# Patient Record
Sex: Female | Born: 1955 | Race: Black or African American | Hispanic: No | Marital: Single
Health system: Southern US, Community
[De-identification: ages and names within clinical notes are randomized; demographics above are authoritative.]

## PROBLEM LIST (undated history)

## (undated) DIAGNOSIS — Z930 Tracheostomy status: Secondary | ICD-10-CM

## (undated) DIAGNOSIS — I270 Primary pulmonary hypertension: Secondary | ICD-10-CM

## (undated) DIAGNOSIS — Z6835 Body mass index (BMI) 35.0-35.9, adult: Secondary | ICD-10-CM

## (undated) DIAGNOSIS — J9690 Respiratory failure, unspecified, unspecified whether with hypoxia or hypercapnia: Secondary | ICD-10-CM

## (undated) DIAGNOSIS — I10 Essential (primary) hypertension: Secondary | ICD-10-CM

## (undated) DIAGNOSIS — Z9911 Dependence on respirator [ventilator] status: Secondary | ICD-10-CM

## (undated) DIAGNOSIS — G4733 Obstructive sleep apnea (adult) (pediatric): Secondary | ICD-10-CM

## (undated) DIAGNOSIS — F419 Anxiety disorder, unspecified: Secondary | ICD-10-CM

## (undated) DIAGNOSIS — N289 Disorder of kidney and ureter, unspecified: Secondary | ICD-10-CM

## (undated) DIAGNOSIS — D649 Anemia, unspecified: Secondary | ICD-10-CM

## (undated) DIAGNOSIS — F209 Schizophrenia, unspecified: Secondary | ICD-10-CM

## (undated) DIAGNOSIS — M1990 Unspecified osteoarthritis, unspecified site: Secondary | ICD-10-CM

## (undated) DIAGNOSIS — K219 Gastro-esophageal reflux disease without esophagitis: Secondary | ICD-10-CM

## (undated) HISTORY — PX: TRACHEOSTOMY: SUR1362

---

## 2017-01-05 ENCOUNTER — Inpatient Hospital Stay (HOSPITAL_COMMUNITY)
Admission: EM | Admit: 2017-01-05 | Discharge: 2017-02-07 | DRG: 870 | Disposition: E | Payer: Medicaid Other | Attending: Internal Medicine | Admitting: Internal Medicine

## 2017-01-05 ENCOUNTER — Inpatient Hospital Stay (HOSPITAL_COMMUNITY): Payer: Medicaid Other

## 2017-01-05 ENCOUNTER — Encounter (HOSPITAL_COMMUNITY): Payer: Self-pay

## 2017-01-05 ENCOUNTER — Emergency Department (HOSPITAL_COMMUNITY): Payer: Medicaid Other

## 2017-01-05 DIAGNOSIS — J9601 Acute respiratory failure with hypoxia: Secondary | ICD-10-CM | POA: Diagnosis not present

## 2017-01-05 DIAGNOSIS — R0600 Dyspnea, unspecified: Secondary | ICD-10-CM | POA: Diagnosis not present

## 2017-01-05 DIAGNOSIS — M1990 Unspecified osteoarthritis, unspecified site: Secondary | ICD-10-CM | POA: Diagnosis not present

## 2017-01-05 DIAGNOSIS — J9611 Chronic respiratory failure with hypoxia: Secondary | ICD-10-CM | POA: Diagnosis not present

## 2017-01-05 DIAGNOSIS — F209 Schizophrenia, unspecified: Secondary | ICD-10-CM | POA: Diagnosis present

## 2017-01-05 DIAGNOSIS — R1010 Upper abdominal pain, unspecified: Secondary | ICD-10-CM

## 2017-01-05 DIAGNOSIS — R34 Anuria and oliguria: Secondary | ICD-10-CM | POA: Diagnosis not present

## 2017-01-05 DIAGNOSIS — M069 Rheumatoid arthritis, unspecified: Secondary | ICD-10-CM | POA: Diagnosis present

## 2017-01-05 DIAGNOSIS — Z452 Encounter for adjustment and management of vascular access device: Secondary | ICD-10-CM

## 2017-01-05 DIAGNOSIS — R0603 Acute respiratory distress: Secondary | ICD-10-CM

## 2017-01-05 DIAGNOSIS — E872 Acidosis: Secondary | ICD-10-CM | POA: Diagnosis not present

## 2017-01-05 DIAGNOSIS — A419 Sepsis, unspecified organism: Secondary | ICD-10-CM | POA: Diagnosis present

## 2017-01-05 DIAGNOSIS — Z87820 Personal history of traumatic brain injury: Secondary | ICD-10-CM | POA: Diagnosis not present

## 2017-01-05 DIAGNOSIS — J9690 Respiratory failure, unspecified, unspecified whether with hypoxia or hypercapnia: Secondary | ICD-10-CM | POA: Diagnosis present

## 2017-01-05 DIAGNOSIS — J80 Acute respiratory distress syndrome: Secondary | ICD-10-CM

## 2017-01-05 DIAGNOSIS — N170 Acute kidney failure with tubular necrosis: Secondary | ICD-10-CM | POA: Diagnosis not present

## 2017-01-05 DIAGNOSIS — Z9911 Dependence on respirator [ventilator] status: Secondary | ICD-10-CM

## 2017-01-05 DIAGNOSIS — R112 Nausea with vomiting, unspecified: Secondary | ICD-10-CM

## 2017-01-05 DIAGNOSIS — I129 Hypertensive chronic kidney disease with stage 1 through stage 4 chronic kidney disease, or unspecified chronic kidney disease: Secondary | ICD-10-CM | POA: Diagnosis not present

## 2017-01-05 DIAGNOSIS — K219 Gastro-esophageal reflux disease without esophagitis: Secondary | ICD-10-CM | POA: Diagnosis not present

## 2017-01-05 DIAGNOSIS — I270 Primary pulmonary hypertension: Secondary | ICD-10-CM | POA: Diagnosis present

## 2017-01-05 DIAGNOSIS — E8779 Other fluid overload: Secondary | ICD-10-CM | POA: Diagnosis not present

## 2017-01-05 DIAGNOSIS — G92 Toxic encephalopathy: Secondary | ICD-10-CM | POA: Diagnosis not present

## 2017-01-05 DIAGNOSIS — E662 Morbid (severe) obesity with alveolar hypoventilation: Secondary | ICD-10-CM | POA: Diagnosis present

## 2017-01-05 DIAGNOSIS — L8990 Pressure ulcer of unspecified site, unspecified stage: Secondary | ICD-10-CM | POA: Insufficient documentation

## 2017-01-05 DIAGNOSIS — R1110 Vomiting, unspecified: Secondary | ICD-10-CM | POA: Diagnosis present

## 2017-01-05 DIAGNOSIS — L89212 Pressure ulcer of right hip, stage 2: Secondary | ICD-10-CM | POA: Diagnosis present

## 2017-01-05 DIAGNOSIS — D631 Anemia in chronic kidney disease: Secondary | ICD-10-CM | POA: Diagnosis present

## 2017-01-05 DIAGNOSIS — J9600 Acute respiratory failure, unspecified whether with hypoxia or hypercapnia: Secondary | ICD-10-CM | POA: Diagnosis not present

## 2017-01-05 DIAGNOSIS — I959 Hypotension, unspecified: Secondary | ICD-10-CM | POA: Diagnosis present

## 2017-01-05 DIAGNOSIS — J690 Pneumonitis due to inhalation of food and vomit: Secondary | ICD-10-CM | POA: Diagnosis not present

## 2017-01-05 DIAGNOSIS — Z930 Tracheostomy status: Secondary | ICD-10-CM

## 2017-01-05 DIAGNOSIS — F5089 Other specified eating disorder: Secondary | ICD-10-CM

## 2017-01-05 DIAGNOSIS — E162 Hypoglycemia, unspecified: Secondary | ICD-10-CM | POA: Diagnosis not present

## 2017-01-05 DIAGNOSIS — N1330 Unspecified hydronephrosis: Secondary | ICD-10-CM | POA: Diagnosis present

## 2017-01-05 DIAGNOSIS — N179 Acute kidney failure, unspecified: Secondary | ICD-10-CM

## 2017-01-05 DIAGNOSIS — N183 Chronic kidney disease, stage 3 (moderate): Secondary | ICD-10-CM | POA: Diagnosis not present

## 2017-01-05 DIAGNOSIS — Z6841 Body Mass Index (BMI) 40.0 and over, adult: Secondary | ICD-10-CM | POA: Diagnosis not present

## 2017-01-05 DIAGNOSIS — E8770 Fluid overload, unspecified: Secondary | ICD-10-CM | POA: Diagnosis not present

## 2017-01-05 DIAGNOSIS — Z4659 Encounter for fitting and adjustment of other gastrointestinal appliance and device: Secondary | ICD-10-CM

## 2017-01-05 DIAGNOSIS — F419 Anxiety disorder, unspecified: Secondary | ICD-10-CM | POA: Diagnosis not present

## 2017-01-05 DIAGNOSIS — R6521 Severe sepsis with septic shock: Secondary | ICD-10-CM | POA: Diagnosis present

## 2017-01-05 DIAGNOSIS — E876 Hypokalemia: Secondary | ICD-10-CM | POA: Diagnosis present

## 2017-01-05 DIAGNOSIS — I468 Cardiac arrest due to other underlying condition: Secondary | ICD-10-CM | POA: Diagnosis not present

## 2017-01-05 DIAGNOSIS — J9621 Acute and chronic respiratory failure with hypoxia: Secondary | ICD-10-CM | POA: Diagnosis not present

## 2017-01-05 DIAGNOSIS — Y95 Nosocomial condition: Secondary | ICD-10-CM | POA: Diagnosis present

## 2017-01-05 DIAGNOSIS — I469 Cardiac arrest, cause unspecified: Secondary | ICD-10-CM | POA: Diagnosis not present

## 2017-01-05 DIAGNOSIS — L89222 Pressure ulcer of left hip, stage 2: Secondary | ICD-10-CM | POA: Diagnosis present

## 2017-01-05 DIAGNOSIS — R109 Unspecified abdominal pain: Secondary | ICD-10-CM

## 2017-01-05 HISTORY — DX: Unspecified osteoarthritis, unspecified site: M19.90

## 2017-01-05 HISTORY — DX: Body mass index (BMI) 35.0-35.9, adult: Z68.35

## 2017-01-05 HISTORY — DX: Obstructive sleep apnea (adult) (pediatric): G47.33

## 2017-01-05 HISTORY — DX: Essential (primary) hypertension: I10

## 2017-01-05 HISTORY — DX: Anxiety disorder, unspecified: F41.9

## 2017-01-05 HISTORY — DX: Anemia, unspecified: D64.9

## 2017-01-05 HISTORY — DX: Morbid (severe) obesity due to excess calories: E66.01

## 2017-01-05 HISTORY — DX: Tracheostomy status: Z93.0

## 2017-01-05 HISTORY — DX: Primary pulmonary hypertension: I27.0

## 2017-01-05 HISTORY — DX: Schizophrenia, unspecified: F20.9

## 2017-01-05 HISTORY — DX: Dependence on respirator (ventilator) status: Z99.11

## 2017-01-05 HISTORY — DX: Gastro-esophageal reflux disease without esophagitis: K21.9

## 2017-01-05 HISTORY — DX: Respiratory failure, unspecified, unspecified whether with hypoxia or hypercapnia: J96.90

## 2017-01-05 HISTORY — DX: Disorder of kidney and ureter, unspecified: N28.9

## 2017-01-05 LAB — COMPREHENSIVE METABOLIC PANEL
ALK PHOS: 101 U/L (ref 38–126)
ALT: 14 U/L (ref 14–54)
ALT: 13 U/L — AB (ref 14–54)
ANION GAP: 16 — AB (ref 5–15)
AST: 15 U/L (ref 15–41)
AST: 15 U/L (ref 15–41)
Albumin: 2.6 g/dL — ABNORMAL LOW (ref 3.5–5.0)
Albumin: 2.4 g/dL — ABNORMAL LOW (ref 3.5–5.0)
Alkaline Phosphatase: 106 U/L (ref 38–126)
Anion gap: 16 — ABNORMAL HIGH (ref 5–15)
BUN: 15 mg/dL (ref 6–20)
BUN: 14 mg/dL (ref 6–20)
CALCIUM: 8.9 mg/dL (ref 8.9–10.3)
CHLORIDE: 96 mmol/L — AB (ref 101–111)
CO2: 29 mmol/L (ref 22–32)
CO2: 28 mmol/L (ref 22–32)
CREATININE: 1.66 mg/dL — AB (ref 0.44–1.00)
Calcium: 9.2 mg/dL (ref 8.9–10.3)
Chloride: 98 mmol/L — ABNORMAL LOW (ref 101–111)
Creatinine, Ser: 1.46 mg/dL — ABNORMAL HIGH (ref 0.44–1.00)
GFR calc non Af Amer: 33 mL/min — ABNORMAL LOW (ref >60)
GFR, EST AFRICAN AMERICAN: 44 mL/min — AB (ref >60)
GFR, EST AFRICAN AMERICAN: 38 mL/min — AB (ref >60)
GFR, EST NON AFRICAN AMERICAN: 38 mL/min — AB (ref >60)
Glucose, Bld: 145 mg/dL — ABNORMAL HIGH (ref 65–99)
Glucose, Bld: 156 mg/dL — ABNORMAL HIGH (ref 65–99)
POTASSIUM: 4 mmol/L (ref 3.5–5.1)
Potassium: 3.8 mmol/L (ref 3.5–5.1)
Sodium: 141 mmol/L (ref 135–145)
Sodium: 142 mmol/L (ref 135–145)
Total Bilirubin: 1 mg/dL (ref 0.3–1.2)
Total Bilirubin: 1.3 mg/dL — ABNORMAL HIGH (ref 0.3–1.2)
Total Protein: 7.3 g/dL (ref 6.5–8.1)
Total Protein: 7.3 g/dL (ref 6.5–8.1)

## 2017-01-05 LAB — I-STAT ARTERIAL BLOOD GAS, ED
ACID-BASE EXCESS: 3 mmol/L — AB (ref 0.0–2.0)
Bicarbonate: 28.5 mmol/L — ABNORMAL HIGH (ref 20.0–28.0)
O2 Saturation: 94 %
PCO2 ART: 48.6 mmHg — AB (ref 32.0–48.0)
PH ART: 7.376 (ref 7.350–7.450)
Patient temperature: 98.6
TCO2: 30 mmol/L (ref 0–100)
pO2, Arterial: 75 mmHg — ABNORMAL LOW (ref 83.0–108.0)

## 2017-01-05 LAB — CBC
HCT: 32.6 % — ABNORMAL LOW (ref 36.0–46.0)
Hemoglobin: 9.6 g/dL — ABNORMAL LOW (ref 12.0–15.0)
MCH: 27.9 pg (ref 26.0–34.0)
MCHC: 29.4 g/dL — AB (ref 30.0–36.0)
MCV: 94.8 fL (ref 78.0–100.0)
PLATELETS: 432 10*3/uL — AB (ref 150–400)
RBC: 3.44 MIL/uL — ABNORMAL LOW (ref 3.87–5.11)
RDW: 17.4 % — ABNORMAL HIGH (ref 11.5–15.5)
WBC: 24.7 10*3/uL — ABNORMAL HIGH (ref 4.0–10.5)

## 2017-01-05 LAB — PHOSPHORUS: PHOSPHORUS: 3.2 mg/dL (ref 2.5–4.6)

## 2017-01-05 LAB — I-STAT CG4 LACTIC ACID, ED
LACTIC ACID, VENOUS: 2.03 mmol/L — AB (ref 0.5–1.9)
Lactic Acid, Venous: 2.05 mmol/L (ref 0.5–1.9)

## 2017-01-05 LAB — CBC WITH DIFFERENTIAL/PLATELET
BASOS ABS: 0.1 10*3/uL (ref 0.0–0.1)
BASOS PCT: 0 %
EOS PCT: 4 %
Eosinophils Absolute: 0.7 10*3/uL (ref 0.0–0.7)
HEMATOCRIT: 33 % — AB (ref 36.0–46.0)
Hemoglobin: 9.5 g/dL — ABNORMAL LOW (ref 12.0–15.0)
Lymphocytes Relative: 8 %
Lymphs Abs: 1.5 10*3/uL (ref 0.7–4.0)
MCH: 27.6 pg (ref 26.0–34.0)
MCHC: 28.8 g/dL — ABNORMAL LOW (ref 30.0–36.0)
MCV: 95.9 fL (ref 78.0–100.0)
MONO ABS: 1.2 10*3/uL — AB (ref 0.1–1.0)
MONOS PCT: 6 %
NEUTROS ABS: 15.7 10*3/uL — AB (ref 1.7–7.7)
Neutrophils Relative %: 82 %
PLATELETS: 426 10*3/uL — AB (ref 150–400)
RBC: 3.44 MIL/uL — ABNORMAL LOW (ref 3.87–5.11)
RDW: 17.4 % — AB (ref 11.5–15.5)
WBC: 19.2 10*3/uL — ABNORMAL HIGH (ref 4.0–10.5)

## 2017-01-05 LAB — URINALYSIS, ROUTINE W REFLEX MICROSCOPIC
Bilirubin Urine: NEGATIVE
GLUCOSE, UA: NEGATIVE mg/dL
Ketones, ur: 20 mg/dL — AB
NITRITE: NEGATIVE
PH: 6 (ref 5.0–8.0)
Protein, ur: 100 mg/dL — AB
SPECIFIC GRAVITY, URINE: 1.015 (ref 1.005–1.030)

## 2017-01-05 LAB — STREP PNEUMONIAE URINARY ANTIGEN: Strep Pneumo Urinary Antigen: NEGATIVE

## 2017-01-05 LAB — LACTIC ACID, PLASMA
LACTIC ACID, VENOUS: 1.4 mmol/L (ref 0.5–1.9)
Lactic Acid, Venous: 1.6 mmol/L (ref 0.5–1.9)

## 2017-01-05 LAB — PROTIME-INR
INR: 1.09
PROTHROMBIN TIME: 14.1 s (ref 11.4–15.2)

## 2017-01-05 LAB — LIPASE, BLOOD: Lipase: 11 U/L (ref 11–51)

## 2017-01-05 LAB — TROPONIN I
TROPONIN I: 0.03 ng/mL — AB (ref <0.03)
Troponin I: 0.05 ng/mL (ref <0.03)

## 2017-01-05 LAB — APTT: aPTT: 31 seconds (ref 24–36)

## 2017-01-05 LAB — MAGNESIUM: MAGNESIUM: 1.7 mg/dL (ref 1.7–2.4)

## 2017-01-05 LAB — AMYLASE: AMYLASE: 88 U/L (ref 28–100)

## 2017-01-05 LAB — PROCALCITONIN: Procalcitonin: 1.59 ng/mL

## 2017-01-05 MED ORDER — SODIUM CHLORIDE 0.9 % IV SOLN
INTRAVENOUS | Status: DC
Start: 2017-01-05 — End: 2017-01-10
  Administered 2017-01-05: 100 mL/h via INTRAVENOUS
  Administered 2017-01-05: 15:00:00 via INTRAVENOUS
  Administered 2017-01-06: 100 mL/h via INTRAVENOUS
  Administered 2017-01-06: 03:00:00 via INTRAVENOUS
  Administered 2017-01-07: 100 mL/h via INTRAVENOUS
  Administered 2017-01-08: 75 mL/h via INTRAVENOUS
  Administered 2017-01-08 – 2017-01-09 (×2): via INTRAVENOUS

## 2017-01-05 MED ORDER — IPRATROPIUM-ALBUTEROL 0.5-2.5 (3) MG/3ML IN SOLN
3.0000 mL | Freq: Four times a day (QID) | RESPIRATORY_TRACT | Status: DC
  Administered 2017-01-05 – 2017-01-09 (×16): 3 mL via RESPIRATORY_TRACT
  Filled 2017-01-05 (×16): qty 3

## 2017-01-05 MED ORDER — VANCOMYCIN HCL 10 G IV SOLR
1500.0000 mg | INTRAVENOUS | Status: DC
  Filled 2017-01-05: qty 1500

## 2017-01-05 MED ORDER — PIPERACILLIN-TAZOBACTAM 3.375 G IVPB 30 MIN
3.3750 g | Freq: Once | INTRAVENOUS | Status: AC
  Administered 2017-01-05: 3.375 g via INTRAVENOUS
  Filled 2017-01-05: qty 50

## 2017-01-05 MED ORDER — ACETAMINOPHEN 650 MG RE SUPP
650.0000 mg | Freq: Once | RECTAL | Status: AC
  Administered 2017-01-05: 650 mg via RECTAL
  Filled 2017-01-05: qty 1

## 2017-01-05 MED ORDER — HEPARIN SODIUM (PORCINE) 5000 UNIT/ML IJ SOLN
5000.0000 [IU] | Freq: Three times a day (TID) | INTRAMUSCULAR | Status: DC
  Administered 2017-01-05 – 2017-01-10 (×16): 5000 [IU] via SUBCUTANEOUS
  Filled 2017-01-05 (×15): qty 1

## 2017-01-05 MED ORDER — ONDANSETRON HCL 4 MG/2ML IJ SOLN
4.0000 mg | Freq: Once | INTRAMUSCULAR | Status: AC
  Administered 2017-01-05: 4 mg via INTRAVENOUS
  Filled 2017-01-05: qty 2

## 2017-01-05 MED ORDER — VANCOMYCIN HCL 10 G IV SOLR
2500.0000 mg | Freq: Once | INTRAVENOUS | Status: AC
  Administered 2017-01-05: 2500 mg via INTRAVENOUS
  Filled 2017-01-05: qty 2500

## 2017-01-05 MED ORDER — FENTANYL CITRATE (PF) 100 MCG/2ML IJ SOLN
25.0000 ug | INTRAMUSCULAR | Status: DC | PRN
  Administered 2017-01-06 – 2017-01-09 (×22): 100 ug via INTRAVENOUS
  Filled 2017-01-05 (×22): qty 2

## 2017-01-05 MED ORDER — PIPERACILLIN-TAZOBACTAM 3.375 G IVPB
3.3750 g | Freq: Three times a day (TID) | INTRAVENOUS | Status: DC
  Administered 2017-01-05 – 2017-01-07 (×5): 3.375 g via INTRAVENOUS
  Filled 2017-01-05 (×6): qty 50

## 2017-01-05 MED ORDER — SODIUM CHLORIDE 0.9 % IV SOLN
1000.0000 mL | INTRAVENOUS | Status: DC
  Administered 2017-01-05 (×2): 1000 mL via INTRAVENOUS

## 2017-01-05 MED ORDER — FAMOTIDINE IN NACL 20-0.9 MG/50ML-% IV SOLN
20.0000 mg | Freq: Two times a day (BID) | INTRAVENOUS | Status: DC
  Administered 2017-01-05 – 2017-01-06 (×4): 20 mg via INTRAVENOUS
  Filled 2017-01-05 (×5): qty 50

## 2017-01-05 MED ORDER — SODIUM CHLORIDE 0.9 % IV SOLN: 250.0000 mL | INTRAVENOUS | Status: DC | PRN

## 2017-01-05 MED ORDER — SODIUM CHLORIDE 0.9 % IV SOLN
3.0000 g | Freq: Once | INTRAVENOUS | Status: AC
  Administered 2017-01-05: 3 g via INTRAVENOUS
  Filled 2017-01-05: qty 3

## 2017-01-05 NOTE — ED Provider Notes (Signed)
[facility]-EMERGENCY DEPT Provider Note   CSN: [id number] Arrival date & time: 01/07/2017  0840     History   Chief Complaint Chief Complaint  Patient presents with  . Aspiration    HPI Hannah Powers is a 61 y.o. female.  61 year old female with history of chronic vent dependency who presents from long-term care facility due to aspiration. Was found by staff with stomach contents coming through her trach site as well as her mouth. She was suction at that time. She was found to be more tachypnic. She Is Also Found to Be Hypotensive. Transport was called for and patient transported here. No further history obtainable due to her current state      No past medical history on file.  There are no active problems to display for this patient.   No past surgical history on file.  OB History    No data available       Home Medications    Prior to Admission medications   Not on File    Family History No family history on file.  Social History Social History  Substance Use Topics  . Smoking status: Not on file  . Smokeless tobacco: Not on file  . Alcohol use Not on file     Allergies   Patient has no allergy information on record.   Review of Systems Review of Systems  Unable to perform ROS: Acuity of condition     Physical Exam Updated Vital Signs There were no vitals taken for this visit.  Physical Exam  Constitutional: She appears well-developed and well-nourished. She is cooperative. She has a sickly appearance. She appears ill.  HENT:  Head: Normocephalic and atraumatic.  Eyes: Conjunctivae, EOM and lids are normal. Pupils are equal, round, and reactive to light.  Neck: Normal range of motion. Neck supple. No tracheal deviation present. No thyroid mass present.  Cardiovascular: Normal rate, regular rhythm and normal heart sounds.  Exam reveals no gallop.   No murmur heard. Pulmonary/Chest: Effort normal. No stridor. No respiratory distress. She has  decreased breath sounds in the right lower field and the left lower field. She has no wheezes. She has rhonchi in the right lower field and the left lower field. She has no rales.  Abdominal: Soft. Normal appearance and bowel sounds are normal. She exhibits no distension. There is no tenderness. There is no rebound and no CVA tenderness.  Musculoskeletal: Normal range of motion. She exhibits no edema or tenderness.  Neurological: She is alert. She displays atrophy. No cranial nerve deficit. GCS eye subscore is 4. GCS verbal subscore is 5. GCS motor subscore is 6.  Skin: Skin is warm and dry. No abrasion and no rash noted.  Nursing note and vitals reviewed.    ED Treatments / Results  Labs (all labs ordered are listed, but only abnormal results are displayed) Labs Reviewed  CULTURE, BLOOD (ROUTINE X 2)  CULTURE, BLOOD (ROUTINE X 2)  COMPREHENSIVE METABOLIC PANEL  CBC WITH DIFFERENTIAL/PLATELET  URINALYSIS, ROUTINE W REFLEX MICROSCOPIC  I-STAT CG4 LACTIC ACID, ED    EKG  EKG Interpretation None       Radiology No results found.  Procedures Procedures (including critical care time)  Medications Ordered in ED Medications  0.9 %  sodium chloride infusion (not administered)     Initial Impression / Assessment and Plan / ED Course  I have reviewed the triage vital signs and the nursing notes.  Pertinent labs & imaging results that were available during  my care of the patient were reviewed by me and considered in my medical decision making (see chart for details).     Patient with evidence of pneumonia on chest x-ray. Code sepsis initiated. Started on IV antibiotics. Fluid boluses well. Patient's initial lactate noted. Patient did have further emesis here. Concern for intra-abdominal process and abdominal CT without contrast is pending. Patient seen by critical care and due to the patient having desaturations will be admitted to the ICU. Critical care will follow-up on the  patient's abdominal CT.  CRITICAL CARE Performed by: Toy Baker Total critical care time: 60 minutes Critical care time was exclusive of separately billable procedures and treating other patients. Critical care was necessary to treat or prevent imminent or life-threatening deterioration. Critical care was time spent personally by me on the following activities: development of treatment plan with patient and/or surrogate as well as nursing, discussions with consultants, evaluation of patient's response to treatment, examination of patient, obtaining history from patient or surrogate, ordering and performing treatments and interventions, ordering and review of laboratory studies, ordering and review of radiographic studies, pulse oximetry and re-evaluation of patient's condition.   Final Clinical Impressions(s) / ED Diagnoses   Final diagnoses:  None    New Prescriptions New Prescriptions   No medications on file     Lorre Nick, MD 12/28/2016 1213

## 2017-01-05 NOTE — Progress Notes (Signed)
Pharmacy Antibiotic Note  Hannah Powers is a 61 y.o. female admitted on 16-Jan-2017 with pneumonia. Hx of chronic vent dependency, presenting from facility due to aspiration. Pharmacy has been consulted for vancomycin/zosyn dosing. Tmax 102.4, WBC 19.2, LA 2.03. SCr 1.46 on admit, normalized CrCL~47.  S/p 1 dose of Unasyn in the ED this AM. No records currently available from facility.  Plan: Zosyn 3.375g IV ( inf) x1; then 3.375g IV q8h (4h inf) Vancomycin 2500mg  IV x1; then 1500mg  IV q24h Monitor clinical progress, c/s, renal function, abx plan/LOT Vancomycin trough as indicated   Weight: 275 lb (124.7 kg)  Temp (24hrs), Avg:102.4 F (39.1 C), Min:102.4 F (39.1 C), Max:102.4 F (39.1 C)   Recent Labs Lab 2017/01/16 0915 2017-01-16 0926  WBC 19.2*  --   CREATININE 1.46*  --   LATICACIDVEN  --  2.03*    CrCl cannot be calculated (Unknown ideal weight.).    No Known Allergies   01/07/17, PharmD, BCPS Clinical Pharmacist Jan 16, 2017 12:14 PM

## 2017-01-05 NOTE — ED Notes (Signed)
RN paged Brett Canales to notify about troponin; awaiting call back

## 2017-01-05 NOTE — ED Notes (Addendum)
IV site accessed. No signs of infiltration; patient denies pain.

## 2017-01-05 NOTE — ED Notes (Signed)
Attempt to draw urine from foley port x 2 with no success.

## 2017-01-05 NOTE — ED Notes (Addendum)
Pt opened eyes and nodded head that she is not in pain at this time.

## 2017-01-05 NOTE — ED Notes (Signed)
Performed pericare and linen change with Malachi Bonds NT and Art therapist. Pt had large soft brown bowel movement.

## 2017-01-05 NOTE — ED Notes (Signed)
Rn asked pharm about vanc

## 2017-01-05 NOTE — H&P (Signed)
PULMONARY / CRITICAL CARE MEDICINE   Name: Hannah Powers MRN: [id number] DOB: 08/16/1956    ADMISSION DATE:  2017-01-20   REFERRING MD:  EDP  CHIEF COMPLAINT:  Aspiration in setting of chronic vent support  HISTORY OF PRESENT ILLNESS:    61 yo mo aaf , resident of [facility] who had massive aspiration 3/29 with food particles in mouth and trach. She was transported to to [facility] ED and CxR was consistent with aspiration and she required high fio2 levels. Very little hx Is currently available. She will be admitted, given full vent support, abx, BD,s and ICU care. We will address code status with family when they arrive. PAST MEDICAL HISTORY :  She  has a past medical history of Anemia; Anxiety; Arthritis; Dependence on home ventilator; GERD (gastroesophageal reflux disease); Hypertension; Obstructive sleep apnea; Primary pulmonary HTN (HCC); Renal disorder; Respiratory failure (HCC); Schizophrenia (HCC); Severe obesity (BMI 35.0-35.9 with comorbidity) (HCC); and Tracheostomy dependence (HCC).  PAST SURGICAL HISTORY: She  has a past surgical history that includes Tracheostomy.  No Known Allergies  No current facility-administered medications on file prior to encounter.    No current outpatient prescriptions on file prior to encounter.    FAMILY HISTORY:  Her has no family status information on file.    SOCIAL HISTORY: Unknown  REVIEW OF SYSTEMS:   Unattainable  SUBJECTIVE:  Mo AAF on vent  VITAL SIGNS: BP (!) 144/72   Pulse (!) 116   Temp (!) 102.4 F (39.1 C) (Oral)   Resp (!) 24   Wt 124.7 kg (275 lb)   SpO2 97%   HEMODYNAMICS:    VENTILATOR SETTINGS: Vent Mode: PRVC FiO2 (%):  [100 %] 100 % Set Rate:  [12 bmp] 12 bmp Vt Set:  [500 mL] 500 mL PEEP:  [8 cmH20] 8 cmH20 Plateau Pressure:  [22 cmH20] 22 cmH20  INTAKE / OUTPUT: No intake/output data recorded.  PHYSICAL EXAMINATION: General: MOAAF, awake and follows commands. Neuro: Follows commands, tacks and  follows HEENT: #6 cuffed trach Cardiovascular: HSD VR 124 Lungs:  Coarse rhonchi thru out Abdomen:  Obese , tense, ecchymosis over entire abd Musculoskeletal: intact Skin: no lower ext edema  LABS:  BMET  Recent Labs Lab 01-20-17 0915  NA 141  K 4.0  CL 96*  CO2 29  BUN 15  CREATININE 1.46*  GLUCOSE 145*    Electrolytes  Recent Labs Lab 2017-01-20 0915  CALCIUM 9.2    CBC  Recent Labs Lab January 20, 2017 0915  WBC 19.2*  HGB 9.5*  HCT 33.0*  PLT 426*    Coag's No results for input(s): APTT, INR in the last 168 hours.  Sepsis Markers  Recent Labs Lab 01/20/2017 0926  LATICACIDVEN 2.03*    ABG No results for input(s): PHART, PCO2ART, PO2ART in the last 168 hours.  Liver Enzymes  Recent Labs Lab 2017/01/20 0915  AST 15  ALT 14  ALKPHOS 106  BILITOT 1.0  ALBUMIN 2.6*    Cardiac Enzymes No results for input(s): TROPONINI, PROBNP in the last 168 hours.  Glucose No results for input(s): GLUCAP in the last 168 hours.  Imaging Dg Chest Port 1 View  Result Date: 01/20/2017 CLINICAL DATA:  Shortness of breath. History of pulmonary hypertension, respiratory failure with tracheostomy and home ventilator use, severe obesity. EXAM: PORTABLE CHEST 1 VIEW COMPARISON:  None in PACs FINDINGS: The lungs are reasonably well inflated. The interstitial markings are increased diffusely. There are confluent alveolar opacities in the left perihilar region. The cardiac  silhouette is enlarged. The pulmonary vascularity is engorged. The tracheostomy tube tip lies at the level of the clavicular heads. IMPRESSION: Interstitial prominence bilaterally may be acute or chronic. If acute this could reflect interstitial pneumonia or interstitial edema. If chronic it may reflect fibrotic change. Superimposed atelectasis or pneumonia is present greatest on the left. Cardiomegaly with mild central pulmonary vascular congestion. Electronically Signed   By: David  [place] M.D.   On: 12/30/2016  09:27     STUDIES:  3/29 ct abd>>  CULTURES: 3/29 bc x 2>> 3/29 sputum>>  ANTIBIOTICS: 3/29 vanc>> 3/20 zoysn>>  SIGNIFICANT EVENTS: 3/29 aspiration  LINES/TUBES: #6 shiley cuffed trach>>  DISCUSSION: 61 yo mo aaf , resident of [facility] who had massive aspiration 3/29 with food particles in mouth and trach. She was transported to to [facility] ED and CxR was consistent with aspiration and she required high fio2 levels. Very little hx Is currently available. She will be admitted, given full vent support, abx, BD,s and ICU care. We will address code status with family when they arrive.  ASSESSMENT / PLAN:  PULMONARY A: Chronic vent/trach dependency with underlying OHS/OSA Massive food aspiration 3/29 P:   Vent bundle BD's Abx  CARDIOVASCULAR A:  Hx of HTN P:  Hold antihypertensives  RENAL Lab Results  Component Value Date   CREATININE 1.46 (H) 12/12/2016    A:   CRI P:   Follow creatine  GASTROINTESTINAL A:   Vomiting with aspiration P:   NPO NGT placement CT abd  HEMATOLOGIC  Recent Labs  01/01/2017 0915  HGB 9.5*    A:   Chronic anemia P:  Serial h/h Dvt protection  INFECTIOUS A:   Presumed aspiration pna HCAP P:   3/29 van/Zoy Pan culture  ENDOCRINE A:   Glucose monitoring P:   Follow glucose on labs  NEUROLOGIC A:   Hx of schizophrenia  Vent tolerance P:   RASS goal: -1 Miold sedation if needed.   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  The patient was on her normal vent settings from [facility] except needing 100% O2 on vent.  On exam, coarse BS  diffusely with a distended abdomen.  I reviewed CXR myself, seem to be chronic changes for the most part with trach in good position.  Labs reviewed.  Will admit to the ICU.  Titrate O2 for sat of 88-92%.  Pan culture.  Vanc/zosyn for aspiration.  Abdomen/pelvic CT for ?etiology of N/V.  Full code status.  The patient is critically ill with multiple organ systems failure and requires high complexity decision making for assessment and support, frequent evaluation and titration of therapies, application of advanced monitoring technologies and extensive interpretation of multiple databases.   Critical Care Time devoted to patient care services described in this note is  45  Minutes. This time reflects time of care of this signee Dr Koren Bound. This critical care time does not reflect procedure time, or teaching time or supervisory time of PA/NP/Med student/Med Resident etc but could involve care discussion time.  Alyson Reedy, M.D. [facility] Pulmonary/Critical Care  Medicine. Pager: [phone]. After hours pager: [phone].

## 2017-01-05 NOTE — ED Notes (Signed)
MD reports he does not want any additional fluid bolus at this time.

## 2017-01-05 NOTE — Progress Notes (Signed)
Patient was transported to CT and to 2H14 from ER B17 on the ventilator with no complications.

## 2017-01-05 NOTE — ED Notes (Signed)
Pt noted to have episode of emesis. MD made aware. Linen change and cleaning preformed and pt adjusted in bed.

## 2017-01-06 DIAGNOSIS — R1010 Upper abdominal pain, unspecified: Secondary | ICD-10-CM

## 2017-01-06 DIAGNOSIS — J9600 Acute respiratory failure, unspecified whether with hypoxia or hypercapnia: Secondary | ICD-10-CM

## 2017-01-06 LAB — POCT I-STAT 3, ART BLOOD GAS (G3+)
Acid-Base Excess: 7 mmol/L — ABNORMAL HIGH (ref 0.0–2.0)
Bicarbonate: 32 mmol/L — ABNORMAL HIGH (ref 20.0–28.0)
O2 SAT: 93 %
TCO2: 33 mmol/L (ref 0–100)
pCO2 arterial: 44.6 mmHg (ref 32.0–48.0)
pH, Arterial: 7.463 — ABNORMAL HIGH (ref 7.350–7.450)
pO2, Arterial: 63 mmHg — ABNORMAL LOW (ref 83.0–108.0)

## 2017-01-06 LAB — BLOOD CULTURE ID PANEL (REFLEXED)
Acinetobacter baumannii: NOT DETECTED
CANDIDA ALBICANS: NOT DETECTED
CANDIDA GLABRATA: NOT DETECTED
CANDIDA KRUSEI: NOT DETECTED
CANDIDA TROPICALIS: NOT DETECTED
Candida parapsilosis: NOT DETECTED
ENTEROBACTER CLOACAE COMPLEX: NOT DETECTED
ENTEROBACTERIACEAE SPECIES: NOT DETECTED
ESCHERICHIA COLI: NOT DETECTED
Enterococcus species: NOT DETECTED
Haemophilus influenzae: NOT DETECTED
KLEBSIELLA OXYTOCA: NOT DETECTED
KLEBSIELLA PNEUMONIAE: NOT DETECTED
Listeria monocytogenes: NOT DETECTED
Methicillin resistance: DETECTED — AB
NEISSERIA MENINGITIDIS: NOT DETECTED
Proteus species: NOT DETECTED
Pseudomonas aeruginosa: NOT DETECTED
STREPTOCOCCUS PYOGENES: NOT DETECTED
STREPTOCOCCUS SPECIES: NOT DETECTED
Serratia marcescens: NOT DETECTED
Staphylococcus aureus (BCID): NOT DETECTED
Staphylococcus species: DETECTED — AB
Streptococcus agalactiae: NOT DETECTED
Streptococcus pneumoniae: NOT DETECTED

## 2017-01-06 LAB — BLOOD GAS, ARTERIAL
ACID-BASE EXCESS: 6.8 mmol/L — AB (ref 0.0–2.0)
BICARBONATE: 31.8 mmol/L — AB (ref 20.0–28.0)
Drawn by: 365271
FIO2: 60
O2 Saturation: 93.2 %
PEEP/CPAP: 14 cmH2O
PH ART: 7.383 (ref 7.350–7.450)
Patient temperature: 98.6
RATE: 16 resp/min
VT: 500 mL
pCO2 arterial: 54.6 mmHg — ABNORMAL HIGH (ref 32.0–48.0)
pO2, Arterial: 71.3 mmHg — ABNORMAL LOW (ref 83.0–108.0)

## 2017-01-06 LAB — BASIC METABOLIC PANEL
ANION GAP: 14 (ref 5–15)
BUN: 19 mg/dL (ref 6–20)
CALCIUM: 8.5 mg/dL — AB (ref 8.9–10.3)
CO2: 29 mmol/L (ref 22–32)
CREATININE: 2.48 mg/dL — AB (ref 0.44–1.00)
Chloride: 98 mmol/L — ABNORMAL LOW (ref 101–111)
GFR calc non Af Amer: 20 mL/min — ABNORMAL LOW (ref >60)
GFR, EST AFRICAN AMERICAN: 23 mL/min — AB (ref >60)
Glucose, Bld: 150 mg/dL — ABNORMAL HIGH (ref 65–99)
Potassium: 3.7 mmol/L (ref 3.5–5.1)
SODIUM: 141 mmol/L (ref 135–145)

## 2017-01-06 LAB — GLUCOSE, CAPILLARY
GLUCOSE-CAPILLARY: 113 mg/dL — AB (ref 65–99)
GLUCOSE-CAPILLARY: 124 mg/dL — AB (ref 65–99)
GLUCOSE-CAPILLARY: 130 mg/dL — AB (ref 65–99)
GLUCOSE-CAPILLARY: 139 mg/dL — AB (ref 65–99)
GLUCOSE-CAPILLARY: 149 mg/dL — AB (ref 65–99)
Glucose-Capillary: 116 mg/dL — ABNORMAL HIGH (ref 65–99)
Glucose-Capillary: 164 mg/dL — ABNORMAL HIGH (ref 65–99)

## 2017-01-06 LAB — URINALYSIS, ROUTINE W REFLEX MICROSCOPIC
Bilirubin Urine: NEGATIVE
Glucose, UA: NEGATIVE mg/dL
KETONES UR: 5 mg/dL — AB
NITRITE: NEGATIVE
PH: 5 (ref 5.0–8.0)
PROTEIN: 100 mg/dL — AB
Specific Gravity, Urine: 1.024 (ref 1.005–1.030)

## 2017-01-06 LAB — VANCOMYCIN, RANDOM: Vancomycin Rm: 31

## 2017-01-06 LAB — OSMOLALITY, URINE: OSMOLALITY UR: 377 mosm/kg (ref 300–900)

## 2017-01-06 LAB — HIV ANTIBODY (ROUTINE TESTING W REFLEX): HIV SCREEN 4TH GENERATION: NONREACTIVE

## 2017-01-06 LAB — TROPONIN I: TROPONIN I: 0.1 ng/mL — AB (ref <0.03)

## 2017-01-06 LAB — CBC
HEMATOCRIT: 26.7 % — AB (ref 36.0–46.0)
Hemoglobin: 7.6 g/dL — ABNORMAL LOW (ref 12.0–15.0)
MCH: 26.9 pg (ref 26.0–34.0)
MCHC: 28.1 g/dL — ABNORMAL LOW (ref 30.0–36.0)
MCV: 95.7 fL (ref 78.0–100.0)
PLATELETS: 415 10*3/uL — AB (ref 150–400)
RBC: 2.79 MIL/uL — ABNORMAL LOW (ref 3.87–5.11)
RDW: 17.6 % — AB (ref 11.5–15.5)
WBC: 24.7 10*3/uL — AB (ref 4.0–10.5)

## 2017-01-06 LAB — URINE CULTURE: Special Requests: NORMAL

## 2017-01-06 LAB — CORTISOL: Cortisol, Plasma: 14.7 ug/dL

## 2017-01-06 LAB — MRSA PCR SCREENING: MRSA by PCR: POSITIVE — AB

## 2017-01-06 LAB — SODIUM, URINE, RANDOM: Sodium, Ur: 17 mmol/L

## 2017-01-06 MED ORDER — CHLORHEXIDINE GLUCONATE 0.12 % MT SOLN
15.0000 mL | Freq: Two times a day (BID) | OROMUCOSAL | Status: DC
  Administered 2017-01-06 – 2017-01-10 (×10): 15 mL via OROMUCOSAL
  Filled 2017-01-06 (×3): qty 15

## 2017-01-06 MED ORDER — MUPIROCIN 2 % EX OINT
1.0000 "application " | TOPICAL_OINTMENT | Freq: Two times a day (BID) | CUTANEOUS | Status: DC
  Administered 2017-01-06 – 2017-01-10 (×9): 1 via NASAL
  Filled 2017-01-06 (×3): qty 22

## 2017-01-06 MED ORDER — INSULIN ASPART 100 UNIT/ML ~~LOC~~ SOLN
0.0000 [IU] | SUBCUTANEOUS | Status: DC
  Administered 2017-01-06: 2 [IU] via SUBCUTANEOUS
  Administered 2017-01-06 – 2017-01-08 (×9): 1 [IU] via SUBCUTANEOUS
  Administered 2017-01-08: 2 [IU] via SUBCUTANEOUS
  Administered 2017-01-09 (×3): 1 [IU] via SUBCUTANEOUS

## 2017-01-06 MED ORDER — CHLORHEXIDINE GLUCONATE CLOTH 2 % EX PADS
6.0000 | MEDICATED_PAD | Freq: Every day | CUTANEOUS | Status: AC
  Administered 2017-01-07 – 2017-01-10 (×4): 6 via TOPICAL

## 2017-01-06 MED ORDER — BIOTENE DRY MOUTH MT LIQD
15.0000 mL | Freq: Four times a day (QID) | OROMUCOSAL | Status: DC
  Administered 2017-01-06 – 2017-01-10 (×18): 15 mL via OROMUCOSAL

## 2017-01-06 NOTE — Progress Notes (Signed)
Initial Nutrition Assessment  DOCUMENTATION CODES:   Morbid obesity  INTERVENTION:   Recommend: Vital High Protein @ 45 ml/hr (1080 ml/day) 60 ml Prostat BID Provides: 1480 kcal, 154 grams protein, and 902 ml free water.   NUTRITION DIAGNOSIS:   Increased nutrient needs related to wound healing as evidenced by estimated needs.  GOAL:   Provide needs based on ASPEN/SCCM guidelines  MONITOR:   Vent status, I & O's  REASON FOR ASSESSMENT:   Consult Assessment of nutrition requirement/status  ASSESSMENT:   61 yo resident of [facility] who had massive aspiration 3/29 with food particles in mouth and trach admitted and now on vent support and NG tube in place.    12 F NG proximal stomach 2.3 L positive, admission weight 275 lb (124.7 kg) - BMI: 45.8 + fever  Nutrition-Focused physical exam completed. Findings are no fat depletion, no muscle depletion, and no edema.  Pt not awake, no tube access identified  Diet Order:  Diet NPO time specified  Skin:  Wound (see comment) (stage II thigh, stage II thigh)  Last BM:  3/29  Height:   Ht Readings from Last 1 Encounters:  02/01/17 5\' 5"  (1.651 m)    Weight:   Wt Readings from Last 1 Encounters:  02/01/17 (!) 303 lb (137.4 kg)    Ideal Body Weight:  56.8 kg  BMI:  Body mass index is 50.42 kg/m.  Estimated Nutritional Needs:   Kcal:  01/07/17  Protein:  > 142 grams   Fluid:  2 L/day  EDUCATION NEEDS:   No education needs identified at this time  [id number] RD, LDN, CNSC [phone] Pager [phone] After Hours Pager

## 2017-01-06 NOTE — Progress Notes (Signed)
PHARMACY - PHYSICIAN COMMUNICATION CRITICAL VALUE ALERT - BLOOD CULTURE IDENTIFICATION (BCID)  Results for orders placed or performed during the hospital encounter of 12/15/2016  Blood Culture ID Panel (Reflexed) (Collected: 12/09/2016  9:10 AM)  Result Value Ref Range   Enterococcus species NOT DETECTED NOT DETECTED   Listeria monocytogenes NOT DETECTED NOT DETECTED   Staphylococcus species DETECTED (A) NOT DETECTED   Staphylococcus aureus NOT DETECTED NOT DETECTED   Methicillin resistance DETECTED (A) NOT DETECTED   Streptococcus species NOT DETECTED NOT DETECTED   Streptococcus agalactiae NOT DETECTED NOT DETECTED   Streptococcus pneumoniae NOT DETECTED NOT DETECTED   Streptococcus pyogenes NOT DETECTED NOT DETECTED   Acinetobacter baumannii NOT DETECTED NOT DETECTED   Enterobacteriaceae species NOT DETECTED NOT DETECTED   Enterobacter cloacae complex NOT DETECTED NOT DETECTED   Escherichia coli NOT DETECTED NOT DETECTED   Klebsiella oxytoca NOT DETECTED NOT DETECTED   Klebsiella pneumoniae NOT DETECTED NOT DETECTED   Proteus species NOT DETECTED NOT DETECTED   Serratia marcescens NOT DETECTED NOT DETECTED   Haemophilus influenzae NOT DETECTED NOT DETECTED   Neisseria meningitidis NOT DETECTED NOT DETECTED   Pseudomonas aeruginosa NOT DETECTED NOT DETECTED   Candida albicans NOT DETECTED NOT DETECTED   Candida glabrata NOT DETECTED NOT DETECTED   Candida krusei NOT DETECTED NOT DETECTED   Candida parapsilosis NOT DETECTED NOT DETECTED   Candida tropicalis NOT DETECTED NOT DETECTED    Name of physician (or Provider) Contacted: Dr. Tyson Alias   Changes to prescribed antibiotics required: none, suspect containment   Pollyann Samples, PharmD, BCPS 01/06/2017, 9:33 AM

## 2017-01-06 NOTE — Progress Notes (Signed)
Pharmacy Antibiotic Note  Hannah Powers is a 61 y.o. female admitted on 12/08/2016 with pneumonia. Hx of chronic vent dependency, presenting from facility due to aspiration. Pharmacy has been consulted for vancomycin/zosyn dosing. WBC 24.7, LA 1.6, Tmax 102.4. SCr 1.46 on admit, now up to 2.5 with rt hydronephrosis and ureter dilation, as well as LLL opacities. Random vancomycin level was 31 less than 24 hours after loading dose. Of note, 1/2 blood cultures positive with MRSE.   Plan: Zosyn 3.375g IV 3.375g IV q8h (4h inf) Vancomycin random with am labs Monitor clinical progress, c/s, renal function, abx plan/LOT Vancomycin trough as indicated   Height: 5\' 5"  (165.1 cm) Weight: (!) 303 lb (137.4 kg) IBW/kg (Calculated) : 57  Temp (24hrs), Avg:98.3 F (36.8 C), Min:98.1 F (36.7 C), Max:98.5 F (36.9 C)   Recent Labs Lab 01/06/2017 0915 12/20/2016 0926 12/09/2016 1258 12/16/2016 1259 01/04/2017 1316 12/26/2016 2000 01/06/17 0039 01/06/17 0922  WBC 19.2*  --  24.7*  --   --   --  24.7*  --   CREATININE 1.46*  --  1.66*  --   --   --  2.48*  --   LATICACIDVEN  --  2.03*  --  1.4 2.05* 1.6  --   --   VANCORANDOM  --   --   --   --   --   --   --  31    Estimated Creatinine Clearance: 34.4 mL/min (A) (by C-G formula based on SCr of 2.48 mg/dL (H)).    No Known Allergies  Antimicrobials this admission:  3/29 vanc >>  3/29 zosyn >>   Dose adjustments this admission:  3/30 VR 31 (@ 0922 on 3/30, last dose vanc 2500mg  at 1312 on 3/29)   Microbiology results:  3/29 BCx: 1/2 GPC (BCID MRSE)  3/30 MRSA PCR: Pos  4/29, BS, PharmD Clinical Pharmacy Resident [phone] (Pager) 01/06/2017 12:28 PM

## 2017-01-06 NOTE — H&P (Signed)
PULMONARY / CRITICAL CARE MEDICINE   Name: Viriginia Powers MRN: [id number] DOB: 12-23-1955    ADMISSION DATE:  Jan 20, 2017   REFERRING MD:  EDP  CHIEF COMPLAINT:  Aspiration in setting of chronic vent support  HISTORY OF PRESENT ILLNESS:    61 yo mo aaf , resident of [facility] who had massive aspiration 3/29 with food particles in mouth and trach. She was transported to to [facility] ED and CxR was consistent with aspiration and she required high fio2 levels. Very little hx Is currently available. She will be admitted, given full vent support, abx, BD,s and ICU care. We will address code status with family when they arrive.  SUBJECTIVE:  No pressors Peep 14 50%  VITAL SIGNS: BP 120/62 (BP Location: Right Arm)   Pulse 99   Temp 98.2 F (36.8 C) (Oral)   Resp 20   Ht 5\' 5"  (1.651 m)   Wt (!) 137.4 kg (303 lb)   SpO2 100%   BMI 50.42 kg/m   HEMODYNAMICS:    VENTILATOR SETTINGS: Vent Mode: PRVC FiO2 (%):  [60 %-100 %] 70 % Set Rate:  [12 bmp-20 bmp] 20 bmp Vt Set:  [500 mL] 500 mL PEEP:  [8 cmH20-14 cmH20] 14 cmH20 Plateau Pressure:  [22 cmH20-27 cmH20] 26 cmH20  INTAKE / OUTPUT: I/O last 3 completed shifts: In: 3055 [I.V.:2555; IV Piggyback:500] Out: 125 [Urine:125]  PHYSICAL EXAMINATION: General: MOAAF, awake and follows commands. Neuro: Follows commands, tacks HEENT: #6 cuffed trach Cardiovasculr, no longer tachy, s1 s2 RRR distant Lungs:  Coarse thorughout Abdomen:  Obese , tense, ecchymosis over entire abd Musculoskeletal: intact Skin: no lower ext edema  LABS:  BMET  Recent Labs Lab 2017/01/20 0915 01-20-17 1258 01/06/17 0039  NA 141 142 141  K 4.0 3.8 3.7  CL 96* 98* 98*  CO2 29 28 29   BUN 15 14 19   CREATININE 1.46* 1.66* 2.48*  GLUCOSE 145* 156* 150*    Electrolytes  Recent Labs Lab 2017-01-20 0915 01-20-17 1258 01/06/17 0039  CALCIUM 9.2 8.9 8.5*  MG  --  1.7  --   PHOS  --  3.2  --     CBC  Recent Labs Lab Jan 20, 2017 0915 January 20, 2017 1258  01/06/17 0039  WBC 19.2* 24.7* 24.7*  HGB 9.5* 9.6* 7.6*  HCT 33.0* 32.6* 26.7*  PLT 426* 432* 415*    Coag's  Recent Labs Lab 01-20-2017 1258  APTT 31  INR 1.09    Sepsis Markers  Recent Labs Lab 20-Jan-2017 1258 01-20-17 1259 January 20, 2017 1316 January 20, 2017 2000  LATICACIDVEN  --  1.4 2.05* 1.6  PROCALCITON 1.59  --   --   --     ABG  Recent Labs Lab 20-Jan-2017 1233 01/06/17 0331  PHART 7.376 7.383  PCO2ART 48.6* 54.6*  PO2ART 75.0* 71.3*    Liver Enzymes  Recent Labs Lab 01/20/2017 0915 January 20, 2017 1258  AST 15 15  ALT 14 13*  ALKPHOS 106 101  BILITOT 1.0 1.3*  ALBUMIN 2.6* 2.4*    Cardiac Enzymes  Recent Labs Lab 01-20-2017 1258 01/20/2017 2000 01/06/17 0039  TROPONINI 0.05* 0.03* 0.10*    Glucose  Recent Labs Lab 20-Jan-2017 2345 01/06/17 0418  GLUCAP 164* 149*    Imaging Ct Abdomen Pelvis Wo Contrast  Result Date: 01/20/17 CLINICAL DATA:  Abdominal pain. Chronically ventilated patient from long-term facility with aspiration, gastric contents coming through trach site. EXAM: CT ABDOMEN AND PELVIS WITHOUT CONTRAST TECHNIQUE: Multidetector CT imaging of the abdomen and pelvis was performed following  the standard protocol without IV contrast. COMPARISON:  None. FINDINGS: Lower chest: Confluent dependent left lower lobe consolidation with air bronchograms. Patchy right lower lobe consolidation. Trace bilateral pleural effusions. Hepatobiliary: Decreased hepatic density consistent with steatosis. No evidence of focal lesion allowing for lack contrast. Gallbladder physiologically distended, no calcified stone. No biliary dilatation. Pancreas: No ductal dilatation or inflammation. Spleen: Normal in size without focal abnormality. Adrenals/Urinary Tract: No adrenal nodule. There is moderate right hydroureteronephrosis. No obstructing stone. Ureteral transition at the level of the pelvic brim without evident cause obstruction. No left hydronephrosis. There is bilateral  perinephric edema that is symmetric and nonspecific. Exophytic cyst in the lower left kidney is suboptimally defined without contrast. Urinary bladder is decompressed by Foley catheter and not evaluated. Stomach/Bowel: The stomach is decompressed. No evidence of bowel wall thickening, distention or inflammation. No bowel obstruction. Normal appendix. Small to moderate stool burden throughout the colon. Vascular/Lymphatic: Aortic and branch atherosclerosis without aneurysm. Multiple prominent periaortic retroperitoneal nodes are nonspecific. No mesenteric or pelvic adenopathy. Reproductive: Uterus and bilateral adnexa are unremarkable. Other: Scattered soft tissue densities in the anterior abdominal wall likely related to injections. Soft tissue edema in both flank regions hands left lateral hip, likely positional anasarca. No ascites or free air. Small uncomplicated fat containing umbilical hernia. Musculoskeletal: Degenerative change throughout the spine, both sacroiliac joints and right greater than left hip. There are no acute or suspicious osseous abnormalities. IMPRESSION: 1. Moderate right hydronephrosis and ureter dilatation to the level of the pelvic brim. No obstructing stone or cause for obstruction is identified. Chronicity is uncertain, this may be chronic. Perinephric edema is symmetric bilaterally and likely related to chronic renal disease. No disproportionate perinephric stranding. Recommend correlation with an or prior imaging if available. 2. Dependent lung base opacities, left greater than right, compatible of aspiration given clinical history. Trace pleural effusions. 3. Prominent retroperitoneal periaortic nodes are nonspecific but likely reactive. 4. Incidental findings of aortic atherosclerosis and hepatic steatosis. Electronically Signed   By: Rubye Oaks M.D.   On: 12/21/2016 21:17   Dg Chest Port 1 View  Result Date: 01/06/2017 CLINICAL DATA:  Shortness of breath. History of  pulmonary hypertension, respiratory failure with tracheostomy and home ventilator use, severe obesity. EXAM: PORTABLE CHEST 1 VIEW COMPARISON:  None in PACs FINDINGS: The lungs are reasonably well inflated. The interstitial markings are increased diffusely. There are confluent alveolar opacities in the left perihilar region. The cardiac silhouette is enlarged. The pulmonary vascularity is engorged. The tracheostomy tube tip lies at the level of the clavicular heads. IMPRESSION: Interstitial prominence bilaterally may be acute or chronic. If acute this could reflect interstitial pneumonia or interstitial edema. If chronic it may reflect fibrotic change. Superimposed atelectasis or pneumonia is present greatest on the left. Cardiomegaly with mild central pulmonary vascular congestion. Electronically Signed   By: David  [place] M.D.   On: 12/09/2016 09:27   Dg Abd Portable 1v  Result Date: 12/17/2016 CLINICAL DATA:  Nasogastric tube placement. EXAM: PORTABLE ABDOMEN - 1 VIEW COMPARISON:  CT abdomen and pelvis January 05, 2017 FINDINGS: Nasogastric tube tip projects in proximal stomach, side port projects at [employer] junction. Included bowel gas pattern is nondilated and nonobstructive. Included soft tissue planes included osseous structures are nonsuspicious. IMPRESSION: Nasogastric tube tip projects in proximal stomach. Electronically Signed   By: Awilda Metro M.D.   On: 12/10/2016 22:48     STUDIES:  3/29 ct abd>>  CULTURES: 3/29 bc x 2>> 3/29 sputum>>  ANTIBIOTICS: 3/29  vanc>> 3/20 zoysn>>  SIGNIFICANT EVENTS: 3/29 aspiration  LINES/TUBES: #6 shiley cuffed trach>>  DISCUSSION: 61 yo mo aaf , resident of [facility] who had massive aspiration 3/29 with food particles in mouth and trach. She was transported to to [facility] ED and CxR was consistent with aspiration and she required high fio2 levels. Very little hx Is currently available. She will be admitted, given full vent support, abx, BD,s and ICU  care. We will address code status with family when they arrive.  ASSESSMENT / PLAN:  PULMONARY A: Chronic vent/trach dependency with underlying OHS/OSA Massive food aspiration 3/29 P:   Keep plat less 30  Obesity will contribute to plat  pcxr in am  ABG reviewed, keep same MV, but lower by 1 cc/kg Goal to peep 12 50% if able  CARDIOVASCULAR A:  Hx of HTN P:  Hold antihypertensives tele  RENAL Lab Results  Component Value Date   CREATININE 2.48 (H) 01/06/2017   CREATININE 1.66 (H) 12/12/2016   CREATININE 1.46 (H) 12/30/2016    A:   CRI ATN P:   Some hydro, likely chronic, may need urology assessment Change foley, leaking and ? Hydro Send UA, urine na, osm Saline remain  GASTROINTESTINAL A:   Vomiting with aspiration P:   NPO NGT placement unable, re attempt CT abd reviewed  HEMATOLOGIC  Recent Labs  01/01/2017 1258 01/06/17 0039  HGB 9.6* 7.6*    A:   Chronic anemia P:  Serial h/h Dvt protection  INFECTIOUS A:   Presumed aspiration pna HCAP P:   3/29 van/Zoy Pan culture, follow sputum  ENDOCRINE A:   Glucose monitoring P:   Follow glucose on labs  NEUROLOGIC A:   Hx of schizophrenia  Vent tolerance P:   RASS goal: -1 Avoid sedation as able fenty   FAMILY  - Updates: 3/29 no famly at bedside  - Inter-disciplinary family meet or Palliative Care meeting due by:  day 7  Ccm time 30 min   Mcarthur Rossetti. Tyson Alias, MD, FACP Pgr: [phone] [facility]

## 2017-01-06 NOTE — Progress Notes (Signed)
While rounding and assessing patient noted that tip of the NGT tube is out of her mouth. Patient appears very uncomfortable. NGT d/c and Dr. Darrick Penna made aware with order to hold for now until am.

## 2017-01-07 DIAGNOSIS — F5089 Other specified eating disorder: Secondary | ICD-10-CM

## 2017-01-07 DIAGNOSIS — J80 Acute respiratory distress syndrome: Secondary | ICD-10-CM

## 2017-01-07 LAB — GLUCOSE, CAPILLARY
GLUCOSE-CAPILLARY: 112 mg/dL — AB (ref 65–99)
GLUCOSE-CAPILLARY: 113 mg/dL — AB (ref 65–99)
Glucose-Capillary: 130 mg/dL — ABNORMAL HIGH (ref 65–99)
Glucose-Capillary: 140 mg/dL — ABNORMAL HIGH (ref 65–99)
Glucose-Capillary: 109 mg/dL — ABNORMAL HIGH (ref 65–99)
Glucose-Capillary: 122 mg/dL — ABNORMAL HIGH (ref 65–99)

## 2017-01-07 LAB — BASIC METABOLIC PANEL
Anion gap: 11 (ref 5–15)
BUN: 20 mg/dL (ref 6–20)
CO2: 29 mmol/L (ref 22–32)
CREATININE: 2.22 mg/dL — AB (ref 0.44–1.00)
Calcium: 8.3 mg/dL — ABNORMAL LOW (ref 8.9–10.3)
Chloride: 103 mmol/L (ref 101–111)
GFR calc Af Amer: 27 mL/min — ABNORMAL LOW (ref >60)
GFR calc non Af Amer: 23 mL/min — ABNORMAL LOW (ref >60)
Glucose, Bld: 127 mg/dL — ABNORMAL HIGH (ref 65–99)
POTASSIUM: 3.2 mmol/L — AB (ref 3.5–5.1)
SODIUM: 143 mmol/L (ref 135–145)

## 2017-01-07 LAB — VANCOMYCIN, RANDOM: VANCOMYCIN RM: 21

## 2017-01-07 MED ORDER — FAMOTIDINE IN NACL 20-0.9 MG/50ML-% IV SOLN
20.0000 mg | INTRAVENOUS | Status: DC
  Administered 2017-01-07 – 2017-01-08 (×2): 20 mg via INTRAVENOUS
  Filled 2017-01-07: qty 50

## 2017-01-07 MED ORDER — VANCOMYCIN HCL 10 G IV SOLR
1500.0000 mg | INTRAVENOUS | Status: DC
  Administered 2017-01-07 – 2017-01-09 (×3): 1500 mg via INTRAVENOUS
  Filled 2017-01-07 (×4): qty 1500

## 2017-01-07 MED ORDER — POTASSIUM CHLORIDE 2 MEQ/ML IV SOLN
Freq: Once | INTRAVENOUS | Status: AC
  Administered 2017-01-07: 05:00:00 via INTRAVENOUS
  Filled 2017-01-07: qty 1000

## 2017-01-07 NOTE — Progress Notes (Signed)
SLP Cancellation Note  Patient Details Name: Hannah Powers MRN: [id number] DOB: Nov 03, 1955   Cancelled treatment:       Reason Eval/Treat Not Completed: Medical issues which prohibited therapy. Order received; MBS warranted. Will plan for tomorrow, SLP to f/u.  Rondel Baton, [place] CF-SLP Speech-Language Pathologist [phone]   Arlana Lindau 01/07/2017, 12:26 PM

## 2017-01-07 NOTE — Progress Notes (Signed)
eLink Physician-Brief Progress Note Patient Name: Hannah Powers DOB: [birth year] MRN: [id number]   Date of Service  01/07/2017  HPI/Events of Note  Hypokalemia  eICU Interventions  Potassium replaced     Intervention Category Intermediate Interventions: Electrolyte abnormality - evaluation and management  DETERDING,ELIZABETH 01/07/2017, 3:45 AM

## 2017-01-07 NOTE — Progress Notes (Signed)
PULMONARY / CRITICAL CARE MEDICINE   Name: Hannah Powers MRN: [id number] DOB: Dec 03, 1955    ADMISSION DATE:  01-15-2017   REFERRING MD:  EDP  CHIEF COMPLAINT:  Aspiration in setting of chronic vent support  HISTORY OF PRESENT ILLNESS:    61 yo mo aaf , resident of [facility] who had massive aspiration 3/29 with food particles in mouth and trach. She was transported to to [facility] ED and CxR was consistent with aspiration and she required high fio2 levels. Very little hx Is currently available. She will be admitted, given full vent support, abx, BD,s and ICU care. We will address code status with family when they arrive.  SUBJECTIVE:  Pulling out all piv Peep is 12, 50%  VITAL SIGNS: BP (!) 136/121   Pulse 91   Temp 99 F (37.2 C) (Oral)   Resp (!) 24   Ht 5\' 5"  (1.651 m)   Wt (!) 137.6 kg (303 lb 6.4 oz)   SpO2 100%   BMI 50.49 kg/m   HEMODYNAMICS:    VENTILATOR SETTINGS: Vent Mode: PRVC FiO2 (%):  [40 %-50 %] 50 % Set Rate:  [26 bmp] 26 bmp Vt Set:  [420 mL] 420 mL PEEP:  [12 cmH20-14 cmH20] 12 cmH20 Plateau Pressure:  [23 cmH20-26 cmH20] 23 cmH20  INTAKE / OUTPUT: I/O last 3 completed shifts: In: 4356.7 [I.V.:3896.7; Other:10; IV Piggyback:450] Out: 625 [Urine:625]  PHYSICAL EXAMINATION: General: MOAAF, awake and follows commands. Neuro: Follows commands, tacks HEENT: #6 cuffed trach Cardiovasculr, s1 s2 RRR Lungs:  Coarse  improved Abdomen:  Obese , tense, ecchymosis over entire abd Musculoskeletal: intact Skin: no lower ext edema  LABS:  BMET  Recent Labs Lab 01/15/17 1258 01/06/17 0039 01/07/17 0235  NA 142 141 143  K 3.8 3.7 3.2*  CL 98* 98* 103  CO2 28 29 29   BUN 14 19 20   CREATININE 1.66* 2.48* 2.22*  GLUCOSE 156* 150* 127*    Electrolytes  Recent Labs Lab 2017-01-15 1258 01/06/17 0039 01/07/17 0235  CALCIUM 8.9 8.5* 8.3*  MG 1.7  --   --   PHOS 3.2  --   --     CBC  Recent Labs Lab January 15, 2017 0915 01/15/2017 1258 01/06/17 0039   WBC 19.2* 24.7* 24.7*  HGB 9.5* 9.6* 7.6*  HCT 33.0* 32.6* 26.7*  PLT 426* 432* 415*    Coag's  Recent Labs Lab 01/15/17 1258  APTT 31  INR 1.09    Sepsis Markers  Recent Labs Lab 01-15-2017 1258 01/15/2017 1259 01/15/2017 1316 2017-01-15 2000  LATICACIDVEN  --  1.4 2.05* 1.6  PROCALCITON 1.59  --   --   --     ABG  Recent Labs Lab 01-15-17 1233 01/06/17 0331 01/06/17 0937  PHART 7.376 7.383 7.463*  PCO2ART 48.6* 54.6* 44.6  PO2ART 75.0* 71.3* 63.0*    Liver Enzymes  Recent Labs Lab 01-15-2017 0915 January 15, 2017 1258  AST 15 15  ALT 14 13*  ALKPHOS 106 101  BILITOT 1.0 1.3*  ALBUMIN 2.6* 2.4*    Cardiac Enzymes  Recent Labs Lab 01/15/2017 1258 2017-01-15 2000 01/06/17 0039  TROPONINI 0.05* 0.03* 0.10*    Glucose  Recent Labs Lab 01/06/17 0737 01/06/17 1123 01/06/17 1552 01/06/17 1921 01/06/17 2351 01/07/17 0321  GLUCAP 130* 116* 139* 124* 113* 130*    Imaging No results found.   STUDIES:  3/29 ct abd>>  CULTURES: 3/29 bc x 2>>BCID MRSA>>> 3/29 sputum>>  ANTIBIOTICS: 3/29 vanc>> 3/20 zoysn>>  SIGNIFICANT EVENTS: 3/29 aspiration  LINES/TUBES: #6 shiley cuffed trach>>  DISCUSSION: 61 yo mo aaf , resident of [facility] who had massive aspiration 3/29 with food particles in mouth and trach. She was transported to to [facility] ED and CxR was consistent with aspiration and she required high fio2 levels. Very little hx Is currently available. She will be admitted, given full vent support, abx, BD,s and ICU care. We will address code status with family when they arrive.  ASSESSMENT / PLAN:  PULMONARY A: Chronic vent/trach dependency with underlying OHS/OSA Massive food aspiration 3/29 P:   Keep plat less 30  Goal is peep to 8 today , likley can obtain With ALI, wold prefer peep now down, keep 50-% pcxr in am  CARDIOVASCULAR A:  Hx of HTN P:  Hold antihypertensives tele  RENAL Lab Results  Component Value Date   CREATININE 2.22 (H)  01/07/2017   CREATININE 2.48 (H) 01/06/2017   CREATININE 1.66 (H) 01-22-2017    A:   CRI ATN Hypovolemia hypok  P:   Need continued pos balance, renals resolving with this Urine studies support dry Keep saline k supp  GASTROINTESTINAL A:   Vomiting with aspiration P:   NPO NGT placement unable, re attempt, and now refusing Get SLP with trach on vent  HEMATOLOGIC  Recent Labs  2017/01/22 1258 01/06/17 0039  HGB 9.6* 7.6*    A:   Chronic anemia P:  Serial h/h Dvt protection  INFECTIOUS A:   Presumed aspiration pna HCAP P:   3/29 van keep for mrsa Dc zosyn  ENDOCRINE A:   Glucose monitoring P:   Follow glucose on labs  NEUROLOGIC A:   Hx of schizophrenia  Vent tolerance P:   RASS goal: 0 Avoid sedation as able fenty   FAMILY  - Updates: 3/29 no famly at bedside  - Inter-disciplinary family meet or Palliative Care meeting due by:  day 7  Ccm time 30 min   Mcarthur Rossetti. Tyson Alias, MD, FACP Pgr: [phone] [facility]

## 2017-01-07 NOTE — Progress Notes (Signed)
Pharmacy Antibiotic Note  Hannah Powers is a 61 y.o. female admitted on 12/31/2016 with pneumonia. Hx of chronic vent dependency, presenting from facility due to aspiration. Pharmacy has been consulted for vancomycin/zosyn dosing. Vancomycin random 21 @ 0230 3/31. Patient pulled all lines ~0900 of 3/31. Zosyn discontinued.  Plan: Vancomycin 1500mg  every 24 hours Monitor clinical progress, c/s, renal function, abx plan/LOT Vancomycin trough as indicated   Height: 5\' 5"  (165.1 cm) Weight: (!) 303 lb 6.4 oz (137.6 kg) IBW/kg (Calculated) : 57  Temp (24hrs), Avg:98.8 F (37.1 C), Min:98.3 F (36.8 C), Max:99.3 F (37.4 C)   Recent Labs Lab 12/30/2016 0915 12/26/2016 0926 12/25/2016 1258 12/28/2016 1259 12/12/2016 1316 01/03/2017 2000 01/06/17 0039 01/06/17 0922 01/07/17 0235  WBC 19.2*  --  24.7*  --   --   --  24.7*  --   --   CREATININE 1.46*  --  1.66*  --   --   --  2.48*  --  2.22*  LATICACIDVEN  --  2.03*  --  1.4 2.05* 1.6  --   --   --   VANCORANDOM  --   --   --   --   --   --   --  31 21    Estimated Creatinine Clearance: 38.4 mL/min (A) (by C-G formula based on SCr of 2.22 mg/dL (H)).    No Known Allergies  Antimicrobials this admission:  3/29 vanc >>  3/29 zosyn >> 3/31  Dose adjustments this admission:  3/30 VR 31 (@ 0922 on 3/30, last dose vanc 2500mg  at 1312 on 3/29)  3/31 VR 0230 21 (Vanc ordered to resume after nurse able to get new line ~1100 3/31)  Microbiology results:  3/29 BCx: 1/2 GPC (BCID MRSA)  3/30 MRSA PCR: Pos  4/31, PharmD Clinical Pharmacy Resident Pager: [phone] 01/07/17 10:05 AM

## 2017-01-08 ENCOUNTER — Inpatient Hospital Stay (HOSPITAL_COMMUNITY): Payer: Medicaid Other

## 2017-01-08 DIAGNOSIS — J9621 Acute and chronic respiratory failure with hypoxia: Secondary | ICD-10-CM

## 2017-01-08 DIAGNOSIS — N179 Acute kidney failure, unspecified: Secondary | ICD-10-CM

## 2017-01-08 LAB — BASIC METABOLIC PANEL
ANION GAP: 13 (ref 5–15)
BUN: 13 mg/dL (ref 6–20)
CALCIUM: 8.4 mg/dL — AB (ref 8.9–10.3)
CO2: 23 mmol/L (ref 22–32)
CREATININE: 1.82 mg/dL — AB (ref 0.44–1.00)
Chloride: 107 mmol/L (ref 101–111)
GFR, EST AFRICAN AMERICAN: 34 mL/min — AB (ref >60)
GFR, EST NON AFRICAN AMERICAN: 29 mL/min — AB (ref >60)
GLUCOSE: 117 mg/dL — AB (ref 65–99)
Potassium: 3.9 mmol/L (ref 3.5–5.1)
Sodium: 143 mmol/L (ref 135–145)

## 2017-01-08 LAB — CBC WITH DIFFERENTIAL/PLATELET
BASOS ABS: 0 10*3/uL (ref 0.0–0.1)
Basophils Relative: 0 %
Eosinophils Absolute: 0.4 10*3/uL (ref 0.0–0.7)
Eosinophils Relative: 3 %
HCT: 24.3 % — ABNORMAL LOW (ref 36.0–46.0)
Hemoglobin: 7.1 g/dL — ABNORMAL LOW (ref 12.0–15.0)
LYMPHS PCT: 18 %
Lymphs Abs: 2.7 10*3/uL (ref 0.7–4.0)
MCH: 27.6 pg (ref 26.0–34.0)
MCHC: 29.2 g/dL — ABNORMAL LOW (ref 30.0–36.0)
MCV: 94.6 fL (ref 78.0–100.0)
Monocytes Absolute: 1.3 10*3/uL — ABNORMAL HIGH (ref 0.1–1.0)
Monocytes Relative: 9 %
NEUTROS ABS: 10.5 10*3/uL — AB (ref 1.7–7.7)
NEUTROS PCT: 70 %
PLATELETS: 359 10*3/uL (ref 150–400)
RBC: 2.57 MIL/uL — ABNORMAL LOW (ref 3.87–5.11)
RDW: 18.2 % — AB (ref 11.5–15.5)
WBC: 14.9 10*3/uL — ABNORMAL HIGH (ref 4.0–10.5)

## 2017-01-08 LAB — CULTURE, BLOOD (ROUTINE X 2)

## 2017-01-08 LAB — GLUCOSE, CAPILLARY
GLUCOSE-CAPILLARY: 124 mg/dL — AB (ref 65–99)
GLUCOSE-CAPILLARY: 121 mg/dL — AB (ref 65–99)
Glucose-Capillary: 108 mg/dL — ABNORMAL HIGH (ref 65–99)
Glucose-Capillary: 111 mg/dL — ABNORMAL HIGH (ref 65–99)
Glucose-Capillary: 115 mg/dL — ABNORMAL HIGH (ref 65–99)
Glucose-Capillary: 119 mg/dL — ABNORMAL HIGH (ref 65–99)
Glucose-Capillary: 154 mg/dL — ABNORMAL HIGH (ref 65–99)

## 2017-01-08 NOTE — Procedures (Signed)
Objective Swallowing Evaluation: Type of Study: FEES-Fiberoptic Endoscopic Evaluation of Swallow  Patient Details  Name: Hannah Powers MRN: [id number] Date of Birth: Feb 28, 1956  Today's Date: 01/08/2017 Time: SLP Start Time (ACUTE ONLY): 1315-SLP Stop Time (ACUTE ONLY): 1400 SLP Time Calculation (min) (ACUTE ONLY): 45 min  Past Medical History:  Past Medical History:  Diagnosis Date  . Anemia   . Anxiety   . Arthritis   . Dependence on home ventilator   . GERD (gastroesophageal reflux disease)   . Hypertension   . Obstructive sleep apnea   . Primary pulmonary HTN (HCC)   . Renal disorder   . Respiratory failure (HCC)   . Schizophrenia (HCC)   . Severe obesity (BMI 35.0-35.9 with comorbidity) (HCC)   . 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 TRACHEOSTOMY     HPI: 60 yo F resident of [facility] who had massive aspiration 3/29 with food particles notedin mouth and trach. She was transported to to [facility] ED where a CXR was consistent with aspiration and she required high FiO2 levels. She was admitted by PCCM.  Apparently, pt vomited.  Per [facility] RT and RN, pt was eating regular diet (particularly cheese burgers and chicken nuggets) PTA while on PC ventilation with cuff up.  Pt has been asking to eat repetitively.  Per RT, pt was using inline PMV occasionally with staff, but did not like to use it.   Subjective: alert, not consistently following commands   Assessment / Plan / Recommendation  CHL IP CLINICAL IMPRESSIONS 01/08/2017  Clinical Impression Despite pt being mechanically ventilated on PRVC with cuff inflated (#6 Shiley), she consumed mechanical solids, pureed solids, and thin liquids with no penetration/ no aspiration.  There was trace-mild vallecular/pyriform residue with solids.  Good vocal fold mobility and full adduction; mild edema interarytenoids.  Pt adamant about resuming oral diet.  Recommend allowing  mechanical soft, thin liquids; elevate bed as much as possible.  SLP will f/u x1 to ensure safety/education.   SLP Visit Diagnosis Dysphagia, unspecified (R13.10)  Attention and concentration deficit following --  Frontal lobe and executive function deficit following --  Impact on safety and function Mild aspiration risk      CHL IP TREATMENT RECOMMENDATION 01/08/2017  Treatment Recommendations Therapy as outlined in treatment plan below     No flowsheet data found.  CHL IP DIET RECOMMENDATION 01/08/2017  SLP Diet Recommendations Dysphagia 3 (Mech soft) solids;Thin liquid  Liquid Administration via Cup;Straw  Medication Administration Whole meds with puree  Compensations --  Postural Changes Remain semi-upright after after feeds/meals (Comment)      CHL IP OTHER RECOMMENDATIONS 01/08/2017  Recommended Consults --  Oral Care Recommendations Oral care BID  Other Recommendations --      CHL IP FOLLOW UP RECOMMENDATIONS 01/08/2017  Follow up Recommendations (No Data)      CHL IP FREQUENCY AND DURATION 01/08/2017  Speech Therapy Frequency (ACUTE ONLY) min 1 x/week  Treatment Duration 1 week           CHL IP ORAL PHASE 01/08/2017  Oral Phase WFL  Oral - Pudding Teaspoon --  Oral - Pudding Cup --  Oral - Honey Teaspoon --  Oral - Honey Cup --  Oral - Nectar Teaspoon --  Oral - Nectar Cup --  Oral - Nectar Straw --  Oral - Thin Teaspoon --  Oral - Thin Cup --  Oral - Thin Straw --  Oral -  Puree --  Oral - Mech Soft --  Oral - Regular --  Oral - Multi-Consistency --  Oral - Pill --  Oral Phase - Comment --    CHL IP PHARYNGEAL PHASE 01/08/2017  Pharyngeal Phase Impaired  Pharyngeal- Pudding Teaspoon --  Pharyngeal --  Pharyngeal- Pudding Cup --  Pharyngeal --  Pharyngeal- Honey Teaspoon --  Pharyngeal --  Pharyngeal- Honey Cup --  Pharyngeal --  Pharyngeal- Nectar Teaspoon --  Pharyngeal --  Pharyngeal- Nectar Cup --  Pharyngeal --  Pharyngeal- Nectar Straw --   Pharyngeal --  Pharyngeal- Thin Teaspoon --  Pharyngeal --  Pharyngeal- Thin Cup --  Pharyngeal --  Pharyngeal- Thin Straw --  Pharyngeal --  Pharyngeal- Puree Reduced tongue base retraction;Reduced laryngeal elevation;Pharyngeal residue - pyriform;Pharyngeal residue - valleculae  Pharyngeal --  Pharyngeal- Mechanical Soft Reduced laryngeal elevation;Reduced tongue base retraction;Pharyngeal residue - valleculae;Pharyngeal residue - pyriform  Pharyngeal --  Pharyngeal- Regular --  Pharyngeal --  Pharyngeal- Multi-consistency --  Pharyngeal --  Pharyngeal- Pill --  Pharyngeal --  Pharyngeal Comment --     No flowsheet data found.  No flowsheet data found. Sirena Riddle L. Samson Frederic, [place] CCC/SLP Pager [phone]  Blenda Mounts Laurice 01/08/2017, 2:19 PM

## 2017-01-08 NOTE — Progress Notes (Signed)
Hannah Powers TEAM 1 - Stepdown/ICU TEAM  [name]  PPJ:[id number] DOB: Aug 01, 1956 DOA: January 12, 2017 PCP: [name], MD    Brief Narrative:  61 yo F resident of [facility] who had massive aspiration 3/29 with food particles noted in mouth and trach. She was transported to to [facility] ED where a CXR was consistent with aspiration and she required high FiO2 levels. She was admitted by PCCM.    Significant Events: 3/29 admit from [facility] by PCCM 4/1 [facility] assumed care   Subjective: Pt c/o feeling airway congestion/trach secretions.  She is also ready to start eating.  She denies cp, n/v, or abdom pain.  She is alert and conversant.    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 Ongoing vent and trach management per PCCM   Grossly abnormal UA Urine culture not helpful - suspect dirty collection given morbid obesity making clean collection extremely difficult   HTN BP stable presently   ATN / Acute kidney failure on suspected CKD of unknown baseline No old labs available for comparison - crt improving w/ volume expansion - follow trend   Recent Labs Lab 2017-01-12 0915 Jan 12, 2017 1258 01/06/17 0039 01/07/17 0235 01/08/17 0235  CREATININE 1.46* 1.66* 2.48* 2.22* 1.82*    Vomiting with aspiration NGT placement unsuccessful w/ pt refusing repeat attempts - for SLP eval today   Anemia of unknown chronicity or etiology  Suspect chronic due to CKD but baseline unknown - no evidence of acute blood loss at present - check Fe indices - follow in serial fashion - decline since admit likely dilution w/ volume expansion (net  +7.5L since admit)  Recent Labs Lab 2017-01-12 0915 2017-01-12 1258 01/06/17 0039 01/08/17 0235  HGB 9.5* 9.6* 7.6* 7.1*    Schizophrenia  Resume home meds when cleared for oral intake   Morbid obesity - Body mass index is 51.25 kg/m.   MRSA screen +   DVT prophylaxis: SQ heparin  Code Status: FULL CODE Family Communication: no family present at time of exam  Disposition Plan: stable for vent capable SDU - possible return to vent capable SNF in 24-48hrs  Consultants:  PCCM  Antimicrobials:  3/29 vanc >  3/29 zosyn > 3/31  Objective: Blood pressure 135/67, pulse 89, temperature 98.8 F (37.1 C), temperature source Oral, resp. rate 18, height 5\' 5"  (1.651 m), weight (!) 139.7 kg (308 lb), SpO2 97 %.  Intake/Output Summary (Last 24 hours) at 01/08/17 1037 Last data filed at 01/08/17 0900  Gross per 24 hour  Intake             2850 ml  Output              600 ml  Net             2250 ml   Filed Weights   January 12, 2017 2133 01/07/17 0324 01/08/17 0600  Weight: (!) 137.4 kg (303 lb) (!) 137.6 kg (303 lb 6.4 oz) (!) 139.7 kg (308 lb)    Examination: General: No acute respiratory distress on vent via trach  Lungs: poor air movement B bases -  no wheezign  Cardiovascular: Regular rate and rhythm without murmur - distant HS  Abdomen: Nontender, morbidly obese, soft, bowel sounds positive, no rebound Extremities: No significant edema bilateral lower extremities  CBC:  Recent Labs Lab 25-Jan-2017 0915 01-25-2017 1258 01/06/17 0039 01/08/17 0235  WBC 19.2* 24.7* 24.7* 14.9*  NEUTROABS 15.7*  --   --  10.5*  HGB 9.5* 9.6* 7.6* 7.1*  HCT 33.0* 32.6* 26.7* 24.3*  MCV 95.9 94.8 95.7 94.6  PLT 426* 432* 415* 359   Basic Metabolic Panel:  Recent Labs Lab 25-Jan-2017 0915 2017-01-25 1258 01/06/17 0039 01/07/17 0235 01/08/17 0235  NA 141 142 141 143 143  K 4.0 3.8 3.7 3.2* 3.9  CL 96* 98* 98* 103 107  CO2 29 28 29 29 23   GLUCOSE 145* 156* 150* 127* 117*  BUN 15 14 19 20 13     CREATININE 1.46* 1.66* 2.48* 2.22* 1.82*  CALCIUM 9.2 8.9 8.5* 8.3* 8.4*  MG  --  1.7  --   --   --   PHOS  --  3.2  --   --   --    GFR: Estimated Creatinine Clearance: 47.3 mL/min (A) (by C-G formula based on SCr of 1.82 mg/dL (H)).  Liver Function Tests:  Recent Labs Lab Jan 25, 2017 0915 25-Jan-2017 1258  AST 15 15  ALT 14 13*  ALKPHOS 106 101  BILITOT 1.0 1.3*  PROT 7.3 7.3  ALBUMIN 2.6* 2.4*    Recent Labs Lab January 25, 2017 1258  LIPASE 11  AMYLASE 88    Coagulation Profile:  Recent Labs Lab January 25, 2017 1258  INR 1.09    Cardiac Enzymes:  Recent Labs Lab January 25, 2017 1258 01-25-2017 2000 01/06/17 0039  TROPONINI 0.05* 0.03* 0.10*   CBG:  Recent Labs Lab 01/07/17 1533 01/07/17 2145 01/07/17 2329 01/08/17 0321 01/08/17 0739  GLUCAP 112* 122* 113* 115* 111*    Recent Results (from the past 240 hour(s))  Blood Culture (routine x 2)     Status: None (Preliminary result)   Collection Time: 2017/01/25  9:05 AM  Result Value Ref Range Status   Specimen Description BLOOD RIGHT BREAST  Final   Special Requests BOTTLES DRAWN AEROBIC AND ANAEROBIC  5CC  Final   Culture NO GROWTH 2 DAYS  Final   Report Status PENDING  Incomplete  Blood Culture (routine x 2)     Status: Abnormal   Collection Time: 2017-01-25  9:10 AM  Result Value Ref Range Status   Specimen Description BLOOD LEFT HAND  Final   Special Requests BOTTLES DRAWN AEROBIC ONLY  5CC  Final   Culture  Setup Time   Final    GRAM POSITIVE COCCI IN CLUSTERS AEROBIC BOTTLE ONLY CRITICAL RESULT CALLED TO, READ BACK BY AND VERIFIED WITH: A. JOHNSTON PHARMD, AT 01/06/17 BY D. VANHOOK    Culture (A)  Final    STAPHYLOCOCCUS SPECIES (COAGULASE NEGATIVE) THE SIGNIFICANCE OF ISOLATING THIS ORGANISM FROM A SINGLE SET OF BLOOD CULTURES WHEN MULTIPLE SETS ARE DRAWN IS UNCERTAIN. PLEASE NOTIFY THE MICROBIOLOGY DEPARTMENT WITHIN ONE WEEK IF SPECIATION AND SENSITIVITIES ARE REQUIRED.    Report Status 01/08/2017 FINAL  Final   Blood Culture ID Panel (Reflexed)     Status: Abnormal   Collection Time: 2017-01-25  9:10 AM  Result Value Ref Range Status   Enterococcus species NOT DETECTED NOT DETECTED Final   Listeria monocytogenes NOT DETECTED NOT DETECTED Final   Staphylococcus species DETECTED (A) NOT DETECTED Final    Comment: Methicillin (oxacillin) resistant coagulase negative  staphylococcus. Possible blood culture contaminant (unless isolated from more than one blood culture draw or clinical case suggests pathogenicity). No antibiotic treatment is indicated for blood  culture contaminants. CRITICAL RESULT CALLED TO, READ BACK BY AND VERIFIED WITH: A. JOHNSTON PHARMD, AT 01/06/17 BY D. VANHOOK    Staphylococcus aureus NOT DETECTED NOT DETECTED Final   Methicillin resistance DETECTED (A) NOT DETECTED Final    Comment: CRITICAL RESULT CALLED TO, READ BACK BY AND VERIFIED WITH: A. JOHNSTON PHARMD, AT 01/06/17 BY D. VANHOOK    Streptococcus species NOT DETECTED NOT DETECTED Final   Streptococcus agalactiae NOT DETECTED NOT DETECTED Final   Streptococcus pneumoniae NOT DETECTED NOT DETECTED Final   Streptococcus pyogenes NOT DETECTED NOT DETECTED Final   Acinetobacter baumannii NOT DETECTED NOT DETECTED Final   Enterobacteriaceae species NOT DETECTED NOT DETECTED Final   Enterobacter cloacae complex NOT DETECTED NOT DETECTED Final   Escherichia coli NOT DETECTED NOT DETECTED Final   Klebsiella oxytoca NOT DETECTED NOT DETECTED Final   Klebsiella pneumoniae NOT DETECTED NOT DETECTED Final   Proteus species NOT DETECTED NOT DETECTED Final   Serratia marcescens NOT DETECTED NOT DETECTED Final   Haemophilus influenzae NOT DETECTED NOT DETECTED Final   Neisseria meningitidis NOT DETECTED NOT DETECTED Final   Pseudomonas aeruginosa NOT DETECTED NOT DETECTED Final   Candida albicans NOT DETECTED NOT DETECTED Final   Candida glabrata NOT DETECTED NOT DETECTED Final   Candida krusei NOT DETECTED NOT DETECTED Final    Candida parapsilosis NOT DETECTED NOT DETECTED Final   Candida tropicalis NOT DETECTED NOT DETECTED Final  Urine culture     Status: Abnormal   Collection Time: 01/29/17 12:30 PM  Result Value Ref Range Status   Specimen Description URINE, CLEAN CATCH  Final   Special Requests Normal  Final   Culture MULTIPLE SPECIES PRESENT, SUGGEST RECOLLECTION (A)  Final   Report Status 01/06/2017 FINAL  Final  MRSA PCR Screening     Status: Abnormal   Collection Time: 01-29-2017  9:43 PM  Result Value Ref Range Status   MRSA by PCR POSITIVE (A) NEGATIVE Final    Comment:        The GeneXpert MRSA Assay (FDA approved for NASAL specimens only), is one component of a comprehensive MRSA colonization surveillance program. It is not intended to diagnose MRSA infection nor to guide or monitor treatment for MRSA infections. RESULT CALLED TO, READ BACK BY AND VERIFIED WITH: Carmelina Peal @0219  01/06/17 MKELLY,MLT      Scheduled Meds: . antiseptic oral rinse  15 mL Mouth Rinse QID  . chlorhexidine  15 mL Mouth/Throat BID  . Chlorhexidine Gluconate Cloth  6 each Topical Q0600  . famotidine (PEPCID) IV  20 mg Intravenous Q24H  . heparin  5,000 Units Subcutaneous Q8H  . insulin aspart  0-9 Units Subcutaneous Q4H  . ipratropium-albuterol  3 mL Nebulization Q6H  . mupirocin ointment  1 application Nasal BID  . vancomycin  1,500 mg Intravenous Q24H     LOS: 3 days   01/08/17, MD [facility] Office  [phone] Pager - Text Page per [phone] as per below:  On-Call/Text Page:      [email or web address]      password [email or web address]  If 7PM-7AM, please contact night-coverage [email or web address] Password [facility] 01/08/2017, 10:37 AM

## 2017-01-08 DEATH — deceased

## 2017-01-09 ENCOUNTER — Other Ambulatory Visit (HOSPITAL_COMMUNITY): Payer: MEDICAID

## 2017-01-09 ENCOUNTER — Inpatient Hospital Stay (HOSPITAL_COMMUNITY): Payer: Medicaid Other

## 2017-01-09 ENCOUNTER — Other Ambulatory Visit: Payer: Self-pay

## 2017-01-09 ENCOUNTER — Encounter (HOSPITAL_COMMUNITY): Payer: Self-pay | Admitting: Emergency Medicine

## 2017-01-09 DIAGNOSIS — Z452 Encounter for adjustment and management of vascular access device: Secondary | ICD-10-CM

## 2017-01-09 LAB — CBC
HCT: 27.1 % — ABNORMAL LOW (ref 36.0–46.0)
Hemoglobin: 7.9 g/dL — ABNORMAL LOW (ref 12.0–15.0)
MCH: 27.3 pg (ref 26.0–34.0)
MCHC: 29.2 g/dL — ABNORMAL LOW (ref 30.0–36.0)
MCV: 93.8 fL (ref 78.0–100.0)
PLATELETS: 447 10*3/uL — AB (ref 150–400)
RBC: 2.89 MIL/uL — ABNORMAL LOW (ref 3.87–5.11)
RDW: 18.2 % — AB (ref 11.5–15.5)
WBC: 22.6 10*3/uL — ABNORMAL HIGH (ref 4.0–10.5)

## 2017-01-09 LAB — POCT I-STAT 3, ART BLOOD GAS (G3+)
Acid-base deficit: 9 mmol/L — ABNORMAL HIGH (ref 0.0–2.0)
Bicarbonate: 18.2 mmol/L — ABNORMAL LOW (ref 20.0–28.0)
O2 Saturation: 96 %
PCO2 ART: 46.3 mmHg (ref 32.0–48.0)
TCO2: 20 mmol/L (ref 0–100)
pH, Arterial: 7.202 — ABNORMAL LOW (ref 7.350–7.450)
pO2, Arterial: 103 mmHg (ref 83.0–108.0)

## 2017-01-09 LAB — COMPREHENSIVE METABOLIC PANEL
ALK PHOS: 105 U/L (ref 38–126)
ALT: 16 U/L (ref 14–54)
AST: 18 U/L (ref 15–41)
Albumin: 2.3 g/dL — ABNORMAL LOW (ref 3.5–5.0)
Anion gap: 14 (ref 5–15)
BILIRUBIN TOTAL: 0.8 mg/dL (ref 0.3–1.2)
BUN: 14 mg/dL (ref 6–20)
CALCIUM: 8.5 mg/dL — AB (ref 8.9–10.3)
CO2: 20 mmol/L — ABNORMAL LOW (ref 22–32)
CREATININE: 2.06 mg/dL — AB (ref 0.44–1.00)
Chloride: 107 mmol/L (ref 101–111)
GFR calc Af Amer: 29 mL/min — ABNORMAL LOW (ref >60)
GFR, EST NON AFRICAN AMERICAN: 25 mL/min — AB (ref >60)
Glucose, Bld: 125 mg/dL — ABNORMAL HIGH (ref 65–99)
POTASSIUM: 4 mmol/L (ref 3.5–5.1)
Sodium: 141 mmol/L (ref 135–145)
TOTAL PROTEIN: 7.1 g/dL (ref 6.5–8.1)

## 2017-01-09 LAB — GLUCOSE, CAPILLARY
GLUCOSE-CAPILLARY: 124 mg/dL — AB (ref 65–99)
GLUCOSE-CAPILLARY: 112 mg/dL — AB (ref 65–99)
GLUCOSE-CAPILLARY: 125 mg/dL — AB (ref 65–99)
Glucose-Capillary: 122 mg/dL — ABNORMAL HIGH (ref 65–99)
Glucose-Capillary: 117 mg/dL — ABNORMAL HIGH (ref 65–99)
Glucose-Capillary: 130 mg/dL — ABNORMAL HIGH (ref 65–99)
Glucose-Capillary: 120 mg/dL — ABNORMAL HIGH (ref 65–99)

## 2017-01-09 LAB — POCT I-STAT, CHEM 8
BUN: 18 mg/dL (ref 6–20)
Calcium, Ion: 1.19 mmol/L (ref 1.15–1.40)
Chloride: 108 mmol/L (ref 101–111)
Creatinine, Ser: 2.9 mg/dL — ABNORMAL HIGH (ref 0.44–1.00)
Glucose, Bld: 119 mg/dL — ABNORMAL HIGH (ref 65–99)
HCT: 28 % — ABNORMAL LOW (ref 36.0–46.0)
Hemoglobin: 9.5 g/dL — ABNORMAL LOW (ref 12.0–15.0)
Potassium: 4.3 mmol/L (ref 3.5–5.1)
Sodium: 140 mmol/L (ref 135–145)
TCO2: 20 mmol/L (ref 0–100)

## 2017-01-09 LAB — RETICULOCYTES
RBC.: 2.89 MIL/uL — AB (ref 3.87–5.11)
RETIC COUNT ABSOLUTE: 40.5 10*3/uL (ref 19.0–186.0)
Retic Ct Pct: 1.4 % (ref 0.4–3.1)

## 2017-01-09 LAB — IRON AND TIBC
IRON: 14 ug/dL — AB (ref 28–170)
Saturation Ratios: 8 % — ABNORMAL LOW (ref 10.4–31.8)
TIBC: 179 ug/dL — ABNORMAL LOW (ref 250–450)
UIBC: 165 ug/dL

## 2017-01-09 LAB — VITAMIN B12: Vitamin B-12: 3083 pg/mL — ABNORMAL HIGH (ref 180–914)

## 2017-01-09 LAB — TROPONIN I: TROPONIN I: 0.04 ng/mL — AB (ref <0.03)

## 2017-01-09 LAB — FOLATE: FOLATE: 8.6 ng/mL (ref >5.9)

## 2017-01-09 LAB — FERRITIN: Ferritin: 971 ng/mL — ABNORMAL HIGH (ref 11–307)

## 2017-01-09 MED ORDER — FAMOTIDINE 20 MG PO TABS
20.0000 mg | ORAL_TABLET | Freq: Every day | ORAL | Status: DC
  Administered 2017-01-09: 20 mg via ORAL
  Filled 2017-01-09: qty 1

## 2017-01-09 MED ORDER — GABAPENTIN 100 MG PO CAPS
100.0000 mg | ORAL_CAPSULE | Freq: Two times a day (BID) | ORAL | Status: DC
  Administered 2017-01-09: 100 mg via ORAL
  Filled 2017-01-09: qty 1

## 2017-01-09 MED ORDER — NITROGLYCERIN IN D5W 200-5 MCG/ML-% IV SOLN
0.0000 ug/min | INTRAVENOUS | Status: DC
  Administered 2017-01-09: 5 ug/min via INTRAVENOUS
  Filled 2017-01-09: qty 250

## 2017-01-09 MED ORDER — MORPHINE SULFATE 15 MG PO TABS: 7.5000 mg | ORAL_TABLET | ORAL | Status: DC | PRN

## 2017-01-09 MED ORDER — FUROSEMIDE 10 MG/ML IJ SOLN
160.0000 mg | INTRAMUSCULAR | Status: AC
  Administered 2017-01-09 – 2017-01-10 (×2): 160 mg via INTRAVENOUS
  Filled 2017-01-09 (×4): qty 16

## 2017-01-09 MED ORDER — LORAZEPAM 2 MG/ML IJ SOLN
0.5000 mg | INTRAMUSCULAR | Status: DC | PRN
  Administered 2017-01-09: 1 mg via INTRAVENOUS
  Administered 2017-01-09: 2 mg via INTRAVENOUS
  Filled 2017-01-09 (×2): qty 1

## 2017-01-09 MED ORDER — MORPHINE SULFATE 15 MG PO TABS: 15.0000 mg | ORAL_TABLET | ORAL | Status: DC | PRN

## 2017-01-09 MED ORDER — FENTANYL 2500MCG IN NS 250ML (10MCG/ML) PREMIX INFUSION
25.0000 ug/h | INTRAVENOUS | Status: DC
  Administered 2017-01-09: 400 ug/h via INTRAVENOUS
  Administered 2017-01-10: 50 ug/h via INTRAVENOUS
  Filled 2017-01-09 (×2): qty 250

## 2017-01-09 MED ORDER — FENTANYL 12 MCG/HR TD PT72
12.5000 ug | MEDICATED_PATCH | TRANSDERMAL | Status: DC
  Administered 2017-01-09: 12.5 ug via TRANSDERMAL
  Filled 2017-01-09: qty 1

## 2017-01-09 MED ORDER — METOPROLOL TARTRATE 5 MG/5ML IV SOLN
2.5000 mg | Freq: Four times a day (QID) | INTRAVENOUS | Status: DC
  Administered 2017-01-09 – 2017-01-10 (×3): 2.5 mg via INTRAVENOUS
  Filled 2017-01-09 (×3): qty 5

## 2017-01-09 MED ORDER — FENTANYL CITRATE (PF) 100 MCG/2ML IJ SOLN
50.0000 ug | Freq: Once | INTRAMUSCULAR | Status: AC
  Administered 2017-01-09: 50 ug via INTRAVENOUS

## 2017-01-09 MED ORDER — FUROSEMIDE 10 MG/ML IJ SOLN
80.0000 mg | Freq: Four times a day (QID) | INTRAMUSCULAR | Status: DC
  Administered 2017-01-09: 80 mg via INTRAVENOUS
  Filled 2017-01-09: qty 8

## 2017-01-09 MED ORDER — CLONAZEPAM 0.5 MG PO TABS
0.2500 mg | ORAL_TABLET | Freq: Two times a day (BID) | ORAL | Status: DC | PRN
  Administered 2017-01-09: 0.25 mg via ORAL
  Filled 2017-01-09: qty 1

## 2017-01-09 MED ORDER — FENTANYL CITRATE (PF) 100 MCG/2ML IJ SOLN
25.0000 ug | INTRAMUSCULAR | Status: DC | PRN
  Administered 2017-01-09 (×3): 25 ug via INTRAVENOUS
  Filled 2017-01-09 (×3): qty 2

## 2017-01-09 MED ORDER — FUROSEMIDE 10 MG/ML IJ SOLN
INTRAMUSCULAR | Status: AC
  Filled 2017-01-09: qty 8

## 2017-01-09 MED ORDER — FENTANYL BOLUS VIA INFUSION
50.0000 ug | INTRAVENOUS | Status: DC | PRN
  Filled 2017-01-09: qty 50

## 2017-01-09 MED ORDER — ASPIRIN 300 MG RE SUPP
150.0000 mg | Freq: Every day | RECTAL | Status: DC
  Administered 2017-01-09: 150 mg via RECTAL
  Filled 2017-01-09: qty 1

## 2017-01-09 MED ORDER — IPRATROPIUM-ALBUTEROL 0.5-2.5 (3) MG/3ML IN SOLN
3.0000 mL | Freq: Four times a day (QID) | RESPIRATORY_TRACT | Status: DC
  Administered 2017-01-09 – 2017-01-10 (×4): 3 mL via RESPIRATORY_TRACT
  Filled 2017-01-09 (×5): qty 3

## 2017-01-09 MED ORDER — PNEUMOCOCCAL VAC POLYVALENT 25 MCG/0.5ML IJ INJ: 0.5000 mL | INJECTION | INTRAMUSCULAR | Status: DC | PRN

## 2017-01-09 MED ORDER — DULOXETINE HCL 30 MG PO CPEP
30.0000 mg | ORAL_CAPSULE | Freq: Every day | ORAL | Status: DC
  Administered 2017-01-09: 30 mg via ORAL
  Filled 2017-01-09 (×2): qty 1

## 2017-01-09 NOTE — Progress Notes (Signed)
  Speech Language Pathology Treatment: Dysphagia  Patient Details Name: Hannah Powers MRN: [id number] DOB: 1956/05/18 Today's Date: 01/09/2017 Time: 0920-0930 SLP Time Calculation (min) (ACUTE ONLY): 10 min  Assessment / Plan / Recommendation Clinical Impression  F/u after yesterday's FEES.  Pt content to be eating, but continues with general anxiety.  RN provided meds/thin liquids.  Now on PS since 8:00 am.  Difficult to assess toleration at bedside as the typical clinical s/s of aspiration may not be evident given pt is on vent, not voicing, cuff up, no access to upper airway.  Offered inline PMV to pt (would restore ability to taste/smell her food, allow cough) -  apparently she has used valve intermittently at [facility], but does not like using it.  Continue mechanical soft, thin liquids, HOB as upright as able.  No further SLP f/u needed - our services will sign off.    HPI HPI: 61 yo F resident of [facility] who had massive aspiration 3/29 with food particles notedin mouth and trach. She was transported to to [facility] ED where a CXR was consistent with aspiration and she required high FiO2 levels. She was admitted by PCCM.  Apparently, pt vomited.  Per [facility] RT and RN, pt was eating regular diet (particularly cheese burgers and chicken nuggets) PTA while on PC ventilation with cuff up.  Pt has been asking to eat repetitively.  Per RT, pt was using inline PMV occasionally with staff, but did not like to use it.       SLP Plan  All goals met       Recommendations  Diet recommendations: Dysphagia 3 (mechanical soft);Thin liquid Liquids provided via: Straw Medication Administration: Whole meds with liquid                Oral Care Recommendations: Oral care BID Follow up Recommendations: LTACH SLP Visit Diagnosis: Dysphagia, unspecified (R13.10) Plan: All goals met       GO               Hannah Powers Hannah Powers, [place] CCC/SLP Pager [phone]  Hannah Powers 01/09/2017, 9:47  AM

## 2017-01-09 NOTE — Progress Notes (Signed)
Dr. Sharon Seller notified of pt restless during renal sonogram.  Orders received.  Will continue to monitor closely.

## 2017-01-09 NOTE — Progress Notes (Signed)
Patient PEEP dropped from 10 to 8 due to sats of 98%.  Placed patient on CPAP/PSV  Of 8/8 with 50% FIO2 and is currently tolerating well.  Sats currently 96%.  Will continue to monitor.

## 2017-01-09 NOTE — Progress Notes (Signed)
eLink Physician-Brief Progress Note Patient Name: Chavy Avera DOB: Apr 24, 1956 MRN: [id number]   Date of Service  01/09/2017  HPI/Events of Note  Respiratory distress - increased RR. Hard to bag. Mucus plugs?   eICU Interventions  Will order: 1. Portable CXR STAT. 2. ABG STAT. 3. Will send APP to evaluate the patient at bedside.         Sommer,Steven Eugene 01/09/2017, 5:10 PM

## 2017-01-09 NOTE — Progress Notes (Signed)
CRITICAL VALUE ALERT  Critical value received:  Troponin 0.04  Date of notification:  01/09/2017  Time of notification:  19:37  Critical value read back:Yes.    Nurse who received alert:  Hillery Jacks, RN  MD notified (1st page):  Dr. Lanier Ensign MD  Time of first page:  19:40  Responding MD:  Dr. Arsenio Loader  Time MD responded:  19:40

## 2017-01-09 NOTE — Progress Notes (Signed)
eLink Physician-Brief Progress Note Patient Name: Hannah Powers DOB: 1956/03/17 MRN: [id number]   Date of Service  01/09/2017  HPI/Events of Note  Review of CXR >> Pulmonary Edema. Creatinine = 2.06.  eICU Interventions  Will order: 1. 12 Lead EKG now. 2. Cycle Troponin. 3. Lasix 80 mg now and again in 6 hours.  4. ASA suppository 150 mg PR now and Q day.  5. Light sedation as tolerated.  6. Increase PEEP to 10.  May require NTG IV infusion as tolerated by BP.     Intervention Category Major Interventions: Respiratory failure - evaluation and management;Other:  Lenell Antu 01/09/2017, 6:21 PM

## 2017-01-09 NOTE — Progress Notes (Signed)
Dr. Dellie Catholic notified of pt without diureses after Lasix 80mg  given.  Orders received.  Will continue to closely monitor.

## 2017-01-09 NOTE — Progress Notes (Signed)
eLink Physician-Brief Progress Note Patient Name: Hannah Powers DOB: [birth year] MRN: [id number]   Date of Service  01/09/2017  HPI/Events of Note  Request to review CXR for R IJ CVL placement. R IJ CVL tip in distal SVC. No pneumothorax.   eICU Interventions  OK to use R IJ CVL.     Intervention Category Intermediate Interventions: Diagnostic test evaluation  Any Mcneice Eugene 01/09/2017, 9:22 PM

## 2017-01-09 NOTE — Progress Notes (Signed)
Vent alarming.  Spo2 52%.  RT at bedside bagging pt.  Elink paged

## 2017-01-09 NOTE — Progress Notes (Signed)
Nutrition Follow-up  DOCUMENTATION CODES:   Morbid obesity  INTERVENTION:   Recommend change diet from Soft (surgical soft) to Dysphagia 3 (mechanical soft) per SLP recommendations.  Continue to encourage po intake at meals and snacks.   NUTRITION DIAGNOSIS:   Increased nutrient needs related to wound healing as evidenced by estimated needs. Ongoing.   GOAL:   Patient will meet greater than or equal to 90% of their needs Progressing.   MONITOR:   PO intake, Vent status, Skin  ASSESSMENT:   61 yo resident of [facility] who had massive aspiration 3/29 with food particles in mouth and trach admitted and now on vent support and NG tube in place.  Pt remains on vent support via trach.  4/1 oral diet resumed after FEES with SLP Pt with good appetite  Labs reviewed: Iron 14, vitamin B12 3083 CBG's: [phone]   Diet Order:  DIET DYS 3 Room service appropriate? Yes; Fluid consistency: Thin  Skin:  Wound (see comment) (stage II on bilateral thighs)  Last BM:  3/31  Height:   Ht Readings from Last 1 Encounters:  January 27, 2017 5\' 5"  (1.651 m)    Weight:   Wt Readings from Last 1 Encounters:  01/09/17 (!) 320 lb (145.2 kg)    Ideal Body Weight:  56.8 kg  BMI:  Body mass index is 53.25 kg/m.  Estimated Nutritional Needs:   Kcal:  1750-1950  Protein:  100-115 grams  Fluid:  2 L/day  EDUCATION NEEDS:   No education needs identified at this time  03/11/17 RD, LDN, CNSC [phone] Pager [phone] After Hours Pager

## 2017-01-09 NOTE — Progress Notes (Signed)
eLink Physician-Brief Progress Note Patient Name: Hannah Powers DOB: July 19, 1956 MRN: [id number]   Date of Service  01/09/2017  HPI/Events of Note  EKG - Sinus Tachycardia - Ventricular rate = 113. Low voltage. Non specific ST-T changes. No urine output from Lasix 80 mg IV.  eICU Interventions  Will order: 1. D/C Lasix 80 mg IV. 2. Lasix 160 mg IV Q 4 hours X 2.  3. Nitroglycerine IV infusion.  4. Metoprolol 2.5 mg IV Q 6 hours. Hold for SBP < 100 or HR < 60.         [name] 01/09/2017, 7:08 PM

## 2017-01-09 NOTE — Progress Notes (Signed)
eLink Physician-Brief Progress Note Patient Name: Hannah Powers DOB: [birth year] MRN: [id number]   Date of Service  01/09/2017  HPI/Events of Note  Troponin = 0.04.  eICU Interventions  Continue present management and continue to trend Troponin.      Intervention Category Intermediate Interventions: Diagnostic test evaluation  Lenell Antu 01/09/2017, 7:41 PM

## 2017-01-09 NOTE — Progress Notes (Addendum)
[facility] TEAM 1 - Stepdown/ICU TEAM  Hannah Powers  ZOX:[id number] DOB: 03/22/1956 DOA: 01-16-17 PCP: [name], MD    Brief Narrative:  61 yo F resident of [facility] who had massive aspiration 3/29 with food particles noted in mouth and trach. She was transported to to [facility] ED where a CXR was consistent with aspiration and she required high FiO2 levels. She was admitted by PCCM.    Significant Events: 3/29 admit from [facility] by PCCM 4/1 [facility] assumed care   Subjective: The pt is c/o diffuse B upper abdom quad and lower chest wall pain which is constant.  Review of her records from [facility] just prior to transfer suggest she was c/o RUQ abdom pain prior to her vomiting episodes.  She just resumed a diet yesterday.  She reports some nausea but denies vomiting.  She is not SOB.    Assessment & Plan:  Massive food aspiration 3/29 - Acute lung injury / aspiration pneumonitis  Care per PCCM - appears to be stabilizing w/ chronic vent care continuing - vent weaning per PCCM   Meth resistant Coag neg Staph in 1 of 2 blood cx Suspect contaminant, but given circumstances can't r/o actual pathogen - remains on Vanc - no chronic indwelling catheters or devices - denies back or shoulder pain at this time - follow clinically for now - will need to have blood cx repeated after completion of abx course   Mod R hydronephrosis  Noted on CT abdom at admission - f/u US to re-evaluate - chronicity unclear   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 Ongoing vent and trach management per PCCM - she tells me she requires the vent "off and on" at [facility], but that she is typically on TC or capped during the day at baseline   Grossly abnormal UA Urine culture not helpful - suspect dirty collection given morbid obesity and hx of long term indwelling foleys  HTN BP trending upward - titrate medical tx and follow   CKD stage 3 Records from [facility] located and indicated CKD stage 3, though crt is not  noted - crt stable - suspect this may be her baseline    Recent Labs Lab 01/16/17 1258 01/06/17 0039 01/07/17 0235 01/08/17 0235 01/09/17 0335  CREATININE 1.66* 2.48* 2.22* 1.82* 2.06*    Vomiting with aspiration NGT placement unsuccessful w/ pt refusing repeat attempts - cleared for diet - no vomiting for now  Anemia of chronic disease Records from [facility] document anemia of chronic disease but do not provide a Hgb - no evidence of acute blood loss at present - Fe indices c/w ACD - decline since admit likely dilution w/ volume expansion (net +7.5L since admit) - Hgb now stable - will not dose w/ IV Fe given small possibility of bacteremia   Recent Labs Lab 01/16/2017 0915 01-16-2017 1258 01/06/17 0039 01/08/17 0235 01/09/17 0335  HGB 9.5* 9.6* 7.6* 7.1* 7.9*    Schizophrenia  Resume home meds when cleared for oral intake   Hepatic Steatosis   Morbid obesity - Body mass index is 53.25 kg/m.   Possible Sarcoidosis by hx  RA on MTX, as well as fentanyl patch and oral narcotics at [facility] - adjust pain meds and follow - hold MTX for now w/ concern for potential bacteremia   MRSA screen +   DVT prophylaxis: SQ heparin  Code Status: FULL CODE Family Communication: no family present at time of exam  Disposition Plan: stable for vent capable SDU  Consultants:  PCCM  Antimicrobials:  3/29 vanc >  3/29 zosyn > 3/31  Objective: Blood pressure (!) 158/95, pulse (!) 126, temperature 100.1 F (37.8 C), temperature source Oral, resp. rate (!) 26, height 5\' 5"  (1.651 m), weight (!) 145.2 kg (320 lb), SpO2 96 %.  Intake/Output Summary (Last 24 hours) at 01/09/17 0834 Last data filed at 01/09/17 0600  Gross per 24 hour  Intake           3597.5 ml  Output              155 ml  Net           3442.5 ml   Filed Weights   01/07/17 0324 01/08/17 0600 01/09/17 0406  Weight: (!) 137.6 kg (303 lb 6.4 oz) (!) 139.7 kg (308 lb) (!) 145.2 kg (320 lb)    Examination: General: No  acute respiratory distress on vent via trach - pt is anxious   Lungs: poor air movement B bases - no wheeze  Cardiovascular: tachycardic - reg rhythm - no M  Abdomen: Nontender, morbidly obese, soft, bowel sounds positive, no rebound Extremities: No significant edema B LE   CBC:  Recent Labs Lab 01-26-17 0915 01/26/2017 1258 01/06/17 0039 01/08/17 0235 01/09/17 0335  WBC 19.2* 24.7* 24.7* 14.9* 22.6*  NEUTROABS 15.7*  --   --  10.5*  --   HGB 9.5* 9.6* 7.6* 7.1* 7.9*  HCT 33.0* 32.6* 26.7* 24.3* 27.1*  MCV 95.9 94.8 95.7 94.6 93.8  PLT 426* 432* 415* 359 447*   Basic Metabolic Panel:  Recent Labs Lab Jan 26, 2017 1258 01/06/17 0039 01/07/17 0235 01/08/17 0235 01/09/17 0335  NA 142 141 143 143 141  K 3.8 3.7 3.2* 3.9 4.0  CL 98* 98* 103 107 107  CO2 28 29 29 23  20*  GLUCOSE 156* 150* 127* 117* 125*  BUN 14 19 20 13 14   CREATININE 1.66* 2.48* 2.22* 1.82* 2.06*  CALCIUM 8.9 8.5* 8.3* 8.4* 8.5*  MG 1.7  --   --   --   --   PHOS 3.2  --   --   --   --    GFR: Estimated Creatinine Clearance: 42.8 mL/min (A) (by C-G formula based on SCr of 2.06 mg/dL (H)).  Liver Function Tests:  Recent Labs Lab 26-Jan-2017 0915 01/26/2017 1258 01/09/17 0335  AST 15 15 18   ALT 14 13* 16  ALKPHOS 106 101 105  BILITOT 1.0 1.3* 0.8  PROT 7.3 7.3 7.1  ALBUMIN 2.6* 2.4* 2.3*    Recent Labs Lab 2017-01-26 1258  LIPASE 11  AMYLASE 88    Coagulation Profile:  Recent Labs Lab 01/26/17 1258  INR 1.09    Cardiac Enzymes:  Recent Labs Lab 01-26-17 1258 2017/01/26 2000 01/06/17 0039  TROPONINI 0.05* 0.03* 0.10*   CBG:  Recent Labs Lab 01/08/17 1619 01/08/17 1945 01/08/17 2337 01/09/17 0345 01/09/17 0725  GLUCAP 121* 154* 119* 122* 124*    Recent Results (from the past 240 hour(s))  Blood Culture (routine x 2)     Status: None (Preliminary result)   Collection Time: 01-26-2017  9:05 AM  Result Value Ref Range Status   Specimen Description BLOOD RIGHT BREAST  Final    Special Requests BOTTLES DRAWN AEROBIC AND ANAEROBIC  5CC  Final   Culture NO GROWTH 3 DAYS  Final   Report Status PENDING  Incomplete  Blood Culture (routine x 2)     Status: Abnormal   Collection Time: 01-26-17  9:10 AM  Result Value Ref Range Status   Specimen Description BLOOD LEFT HAND  Final   Special Requests BOTTLES DRAWN AEROBIC ONLY  5CC  Final   Culture  Setup Time   Final    GRAM POSITIVE COCCI IN CLUSTERS AEROBIC BOTTLE ONLY CRITICAL RESULT CALLED TO, READ BACK BY AND VERIFIED WITH: A. JOHNSTON PHARMD, AT 01/06/17 BY D. VANHOOK    Culture (A)  Final    STAPHYLOCOCCUS SPECIES (COAGULASE NEGATIVE) THE SIGNIFICANCE OF ISOLATING THIS ORGANISM FROM A SINGLE SET OF BLOOD CULTURES WHEN MULTIPLE SETS ARE DRAWN IS UNCERTAIN. PLEASE NOTIFY THE MICROBIOLOGY DEPARTMENT WITHIN ONE WEEK IF SPECIATION AND SENSITIVITIES ARE REQUIRED.    Report Status 01/08/2017 FINAL  Final  Blood Culture ID Panel (Reflexed)     Status: Abnormal   Collection Time: 12/11/2016  9:10 AM  Result Value Ref Range Status   Enterococcus species NOT DETECTED NOT DETECTED Final   Listeria monocytogenes NOT DETECTED NOT DETECTED Final   Staphylococcus species DETECTED (A) NOT DETECTED Final    Comment: Methicillin (oxacillin) resistant coagulase negative staphylococcus. Possible blood culture contaminant (unless isolated from more than one blood culture draw or clinical case suggests pathogenicity). No antibiotic treatment is indicated for blood  culture contaminants. CRITICAL RESULT CALLED TO, READ BACK BY AND VERIFIED WITH: A. JOHNSTON PHARMD, AT 01/06/17 BY D. VANHOOK    Staphylococcus aureus NOT DETECTED NOT DETECTED Final   Methicillin resistance DETECTED (A) NOT DETECTED Final    Comment: CRITICAL RESULT CALLED TO, READ BACK BY AND VERIFIED WITH: A. JOHNSTON PHARMD, AT 01/06/17 BY D. VANHOOK    Streptococcus species NOT DETECTED NOT DETECTED Final   Streptococcus agalactiae NOT DETECTED NOT DETECTED Final    Streptococcus pneumoniae NOT DETECTED NOT DETECTED Final   Streptococcus pyogenes NOT DETECTED NOT DETECTED Final   Acinetobacter baumannii NOT DETECTED NOT DETECTED Final   Enterobacteriaceae species NOT DETECTED NOT DETECTED Final   Enterobacter cloacae complex NOT DETECTED NOT DETECTED Final   Escherichia coli NOT DETECTED NOT DETECTED Final   Klebsiella oxytoca NOT DETECTED NOT DETECTED Final   Klebsiella pneumoniae NOT DETECTED NOT DETECTED Final   Proteus species NOT DETECTED NOT DETECTED Final   Serratia marcescens NOT DETECTED NOT DETECTED Final   Haemophilus influenzae NOT DETECTED NOT DETECTED Final   Neisseria meningitidis NOT DETECTED NOT DETECTED Final   Pseudomonas aeruginosa NOT DETECTED NOT DETECTED Final   Candida albicans NOT DETECTED NOT DETECTED Final   Candida glabrata NOT DETECTED NOT DETECTED Final   Candida krusei NOT DETECTED NOT DETECTED Final   Candida parapsilosis NOT DETECTED NOT DETECTED Final   Candida tropicalis NOT DETECTED NOT DETECTED Final  Urine culture     Status: Abnormal   Collection Time: 12/22/2016 12:30 PM  Result Value Ref Range Status   Specimen Description URINE, CLEAN CATCH  Final   Special Requests Normal  Final   Culture MULTIPLE SPECIES PRESENT, SUGGEST RECOLLECTION (A)  Final   Report Status 01/06/2017 FINAL  Final  MRSA PCR Screening     Status: Abnormal   Collection Time: 12/20/2016  9:43 PM  Result Value Ref Range Status   MRSA by PCR POSITIVE (A) NEGATIVE Final    Comment:        The GeneXpert MRSA Assay (FDA approved for NASAL specimens only), is one component of a comprehensive MRSA colonization surveillance program. It is not intended to diagnose MRSA infection nor to guide or monitor treatment for MRSA infections. RESULT CALLED TO, READ BACK  BY AND VERIFIED WITH: Carmelina Peal @0219  01/06/17 MKELLY,MLT      Scheduled Meds: . antiseptic oral rinse  15 mL Mouth Rinse QID  . chlorhexidine  15 mL Mouth/Throat BID  .  Chlorhexidine Gluconate Cloth  6 each Topical Q0600  . famotidine (PEPCID) IV  20 mg Intravenous Q24H  . heparin  5,000 Units Subcutaneous Q8H  . insulin aspart  0-9 Units Subcutaneous Q4H  . ipratropium-albuterol  3 mL Nebulization Q6H  . mupirocin ointment  1 application Nasal BID  . vancomycin  1,500 mg Intravenous Q24H     LOS: 4 days   01/08/17, MD [facility] Office  [phone] Pager - Text Page per [phone] as per below:  On-Call/Text Page:      [email or web address]      password [email or web address]  If 7PM-7AM, please contact night-coverage [email or web address] Password [facility] 01/09/2017, 8:34 AM

## 2017-01-09 NOTE — Progress Notes (Signed)
RT called to patient room due to patient reading with an oxygen sat in the 50s, with a good waveform.  Upon arrival patient noted to be struggling to breath.  Took patient off of ventilator and began to bag and bag lavaged patient.  Obtained a small amount of thick, tan mucus plugs.  Patient still continued to struggle to breath.  [facility] notified.  ABG was obtained and CXR was ordered.  Results given to MD at bedside.  Per Dr. Molli Knock, ventilator changes made and will order different sedation.  Sats currently at 98%.  Will continue to monitor.

## 2017-01-09 NOTE — Procedures (Signed)
Central Venous Catheter Insertion Procedure Note Hannah Powers [id number] January 05, 1956  Procedure: Insertion of Central Venous Catheter Indications: Assessment of intravascular volume, Drug and/or fluid administration and Frequent blood sampling  Procedure Details Consent: Risks of procedure as well as the alternatives and risks of each were explained to the (patient/caregiver).  Consent for procedure obtained. Time Out: Verified patient identification, verified procedure, site/side was marked, verified correct patient position, special equipment/implants available, medications/allergies/relevent history reviewed, required imaging and test results available.  Performed  Maximum sterile technique was used including antiseptics, cap, gloves, gown, hand hygiene, mask and sheet. Skin prep: Chlorhexidine; local anesthetic administered A antimicrobial bonded/coated triple lumen catheter was placed in the right internal jugular vein using the Seldinger technique.  Evaluation Blood flow good Complications: No apparent complications Patient did tolerate procedure well. Chest X-ray ordered to verify placement.  CXR: pending.  Procedure performed under direct ultrasound guidance for real time vessel cannulation.      Rutherford Guys, [place] [facility] Pager: [phone]  or [phone] 01/09/2017, 8:38 PM

## 2017-01-09 NOTE — Progress Notes (Signed)
PULMONARY / CRITICAL CARE MEDICINE   Name: Hannah Powers MRN: [id number] DOB: June 22, 1956    ADMISSION DATE:  30-Jan-2017   REFERRING MD:  EDP  CHIEF COMPLAINT:  Aspiration in setting of chronic vent support  HISTORY OF PRESENT ILLNESS:    61 yo mo aaf , resident of [facility] who had massive aspiration 3/29 with food particles in mouth and trach. She was transported to to [facility] ED and CxR was consistent with aspiration and she required high fio2 levels. She will be admitted, given full vent support, abx, BD,s and ICU care.   SUBJECTIVE:  She is currently tolerating PEEP 8, pressure support 8 Just received some fentanyl for some degree of tachypnea and agitation. Past swallowing evaluation on 01/08/17. Has been cleared to take by mouth with her cuff up Blood cultures 1 of 2 bottles MRSE  VITAL SIGNS: BP (!) 158/95 (BP Location: Right Arm)   Pulse (!) 126   Temp 100.1 F (37.8 C) (Oral)   Resp (!) 26   Ht 5\' 5"  (1.651 m)   Wt (!) 145.2 kg (320 lb)   SpO2 96%   BMI 53.25 kg/m   HEMODYNAMICS:    VENTILATOR SETTINGS: Vent Mode: PSV;CPAP FiO2 (%):  [40 %-50 %] 50 % Set Rate:  [26 bmp] 26 bmp Vt Set:  [420 mL] 420 mL PEEP:  [8 cmH20-10 cmH20] 8 cmH20 Pressure Support:  [8 cmH20] 8 cmH20 Plateau Pressure:  [18 cmH20-25 cmH20] 25 cmH20  INTAKE / OUTPUT: I/O last 3 completed shifts: In: 4897.5 [P.O.:1320; I.V.:3027.5; IV Piggyback:550] Out: 560 [Urine:560]  PHYSICAL EXAMINATION: General: Obese ill-appearing woman, on pressure support ventilation Neuro: Sedated, wakes easily to voice, follows instructions HEENT: Oropharynx clear, trach in good position, trach site clean and dry intact Cardiovascular: Regular, no murmur, distant Lungs: Distant breath sounds bilaterally, bilateral scattered inspiratory crackles Abdomen:  Obese, distended, hypoactive bowel sounds Musculoskeletal: No deformity Skin: Dry, warm, no edema  LABS:   BMET  Recent Labs Lab 01/07/17 0235  01/08/17 0235 01/09/17 0335  NA 143 143 141  K 3.2* 3.9 4.0  CL 103 107 107  CO2 29 23 20*  BUN 20 13 14   CREATININE 2.22* 1.82* 2.06*  GLUCOSE 127* 117* 125*    Electrolytes  Recent Labs Lab 01-30-2017 1258  01/07/17 0235 01/08/17 0235 01/09/17 0335  CALCIUM 8.9  < > 8.3* 8.4* 8.5*  MG 1.7  --   --   --   --   PHOS 3.2  --   --   --   --   < > = values in this interval not displayed.  CBC  Recent Labs Lab 01/06/17 0039 01/08/17 0235 01/09/17 0335  WBC 24.7* 14.9* 22.6*  HGB 7.6* 7.1* 7.9*  HCT 26.7* 24.3* 27.1*  PLT 415* 359 447*    Coag's  Recent Labs Lab January 30, 2017 1258  APTT 31  INR 1.09    Sepsis Markers  Recent Labs Lab 01/30/17 1258 01/30/17 1259 01-30-17 1316 01/30/17 2000  LATICACIDVEN  --  1.4 2.05* 1.6  PROCALCITON 1.59  --   --   --     ABG  Recent Labs Lab January 30, 2017 1233 01/06/17 0331 01/06/17 0937  PHART 7.376 7.383 7.463*  PCO2ART 48.6* 54.6* 44.6  PO2ART 75.0* 71.3* 63.0*    Liver Enzymes  Recent Labs Lab 01/30/17 0915 Jan 30, 2017 1258 01/09/17 0335  AST 15 15 18   ALT 14 13* 16  ALKPHOS 106 101 105  BILITOT 1.0 1.3* 0.8  ALBUMIN 2.6*  2.4* 2.3*    Cardiac Enzymes  Recent Labs Lab 01-27-17 1258 01/27/2017 2000 01/06/17 0039  TROPONINI 0.05* 0.03* 0.10*    Glucose  Recent Labs Lab 01/08/17 1221 01/08/17 1619 01/08/17 1945 01/08/17 2337 01/09/17 0345 01/09/17 0725  GLUCAP 108* 121* 154* 119* 122* 124*    Imaging No results found.   STUDIES:  3/29 ct abd>>  CULTURES: 3/29 bc x 2>>BCID MRSA>>> 1 of 2 coag-negative staph 3/29 sputum>>  ANTIBIOTICS: 3/29 vanc>> 3/20 zoysn>> 4/1  SIGNIFICANT EVENTS: 3/29 aspiration  LINES/TUBES: #6 shiley cuffed trach>>  DISCUSSION: 61 yo mo aaf , resident of [facility] who had massive aspiration 3/29 with food particles in mouth and trach. She was transported to to [facility] ED and CxR was consistent with aspiration and she required high fio2 levels. Completed  antibiotic for aspiration. Currently working on weaning ventilator needs, not back to her baseline yet.  ASSESSMENT / PLAN:  PULMONARY A: Chronic vent/trach dependency with underlying OHS/OSA. Unclear how often she is off mechanical ventilation at baseline Massive food aspiration 3/29, comp by acute lung injury and probable pneumonia P:   Goal plateau pressures less than 30 Continue to wean PEEP as tolerated. Currently at 8 and no room to decrease at this time. Continue pressure support as she can tolerate. Not in a position to move to trach collar at this time but this will be a goal. She will almost certainly need mechanical ventilation at night given her obesity hypoventilation syndrome Follow chest x-ray intermittently   GASTROINTESTINAL A:   Vomiting with aspiration Diet  P:   Speech therapy has performed FEES and cleared the patient for oral intake with cuff up and on MV  INFECTIOUS A:   Presumed aspiration pna HCAP MRSE 1 of 2 bottles, ? contaminant P:   Zosyn completed for aspiration pneumonia Question DC vancomycin if we believe staph epi is a contaminant   Levy Pupa, MD, PhD 01/09/2017, 10:09 AM [facility] [phone] or if no answer [phone]

## 2017-01-10 ENCOUNTER — Inpatient Hospital Stay (HOSPITAL_COMMUNITY): Payer: Medicaid Other

## 2017-01-10 DIAGNOSIS — L8990 Pressure ulcer of unspecified site, unspecified stage: Secondary | ICD-10-CM | POA: Insufficient documentation

## 2017-01-10 DIAGNOSIS — E8779 Other fluid overload: Secondary | ICD-10-CM

## 2017-01-10 DIAGNOSIS — I469 Cardiac arrest, cause unspecified: Secondary | ICD-10-CM

## 2017-01-10 DIAGNOSIS — R0600 Dyspnea, unspecified: Secondary | ICD-10-CM

## 2017-01-10 DIAGNOSIS — J9601 Acute respiratory failure with hypoxia: Secondary | ICD-10-CM

## 2017-01-10 DIAGNOSIS — I959 Hypotension, unspecified: Secondary | ICD-10-CM

## 2017-01-10 LAB — GLUCOSE, CAPILLARY
GLUCOSE-CAPILLARY: 83 mg/dL (ref 65–99)
GLUCOSE-CAPILLARY: 85 mg/dL (ref 65–99)
GLUCOSE-CAPILLARY: 170 mg/dL — AB (ref 65–99)
Glucose-Capillary: 32 mg/dL — CL (ref 65–99)
Glucose-Capillary: 27 mg/dL — CL (ref 65–99)
Glucose-Capillary: 63 mg/dL — ABNORMAL LOW (ref 65–99)
Glucose-Capillary: 194 mg/dL — ABNORMAL HIGH (ref 65–99)

## 2017-01-10 LAB — CBC
HCT: 27.7 % — ABNORMAL LOW (ref 36.0–46.0)
HCT: 25.4 % — ABNORMAL LOW (ref 36.0–46.0)
HEMOGLOBIN: 7.9 g/dL — AB (ref 12.0–15.0)
HEMOGLOBIN: 7.2 g/dL — AB (ref 12.0–15.0)
MCH: 27.2 pg (ref 26.0–34.0)
MCH: 28.3 pg (ref 26.0–34.0)
MCHC: 28.5 g/dL — ABNORMAL LOW (ref 30.0–36.0)
MCHC: 28.3 g/dL — ABNORMAL LOW (ref 30.0–36.0)
MCV: 95.5 fL (ref 78.0–100.0)
MCV: 100 fL (ref 78.0–100.0)
Platelets: 438 10*3/uL — ABNORMAL HIGH (ref 150–400)
Platelets: 393 10*3/uL (ref 150–400)
RBC: 2.9 MIL/uL — AB (ref 3.87–5.11)
RBC: 2.54 MIL/uL — AB (ref 3.87–5.11)
RDW: 18.3 % — ABNORMAL HIGH (ref 11.5–15.5)
RDW: 18.7 % — ABNORMAL HIGH (ref 11.5–15.5)
WBC: 32.4 10*3/uL — ABNORMAL HIGH (ref 4.0–10.5)
WBC: 31.1 10*3/uL — ABNORMAL HIGH (ref 4.0–10.5)

## 2017-01-10 LAB — COMPREHENSIVE METABOLIC PANEL
ALT: 38 U/L (ref 14–54)
ALT: 93 U/L — ABNORMAL HIGH (ref 14–54)
ANION GAP: 21 — AB (ref 5–15)
AST: 88 U/L — AB (ref 15–41)
AST: 276 U/L — ABNORMAL HIGH (ref 15–41)
Albumin: 2.2 g/dL — ABNORMAL LOW (ref 3.5–5.0)
Albumin: 1.7 g/dL — ABNORMAL LOW (ref 3.5–5.0)
Alkaline Phosphatase: 114 U/L (ref 38–126)
Alkaline Phosphatase: 95 U/L (ref 38–126)
Anion gap: 13 (ref 5–15)
BILIRUBIN TOTAL: 1.1 mg/dL (ref 0.3–1.2)
BUN: 21 mg/dL — AB (ref 6–20)
BUN: 22 mg/dL — ABNORMAL HIGH (ref 6–20)
CHLORIDE: 108 mmol/L (ref 101–111)
CHLORIDE: 103 mmol/L (ref 101–111)
CO2: 17 mmol/L — ABNORMAL LOW (ref 22–32)
CO2: 16 mmol/L — AB (ref 22–32)
Calcium: 8.5 mg/dL — ABNORMAL LOW (ref 8.9–10.3)
Calcium: 10.2 mg/dL (ref 8.9–10.3)
Creatinine, Ser: 3.64 mg/dL — ABNORMAL HIGH (ref 0.44–1.00)
Creatinine, Ser: 4.05 mg/dL — ABNORMAL HIGH (ref 0.44–1.00)
GFR calc Af Amer: 13 mL/min — ABNORMAL LOW (ref >60)
GFR calc non Af Amer: 11 mL/min — ABNORMAL LOW (ref >60)
GFR, EST AFRICAN AMERICAN: 15 mL/min — AB (ref >60)
GFR, EST NON AFRICAN AMERICAN: 13 mL/min — AB (ref >60)
Glucose, Bld: 82 mg/dL (ref 65–99)
Glucose, Bld: 305 mg/dL — ABNORMAL HIGH (ref 65–99)
Potassium: 5.7 mmol/L — ABNORMAL HIGH (ref 3.5–5.1)
Potassium: 6.4 mmol/L (ref 3.5–5.1)
SODIUM: 140 mmol/L (ref 135–145)
Sodium: 138 mmol/L (ref 135–145)
Total Bilirubin: 0.8 mg/dL (ref 0.3–1.2)
Total Protein: 7.1 g/dL (ref 6.5–8.1)
Total Protein: 5.5 g/dL — ABNORMAL LOW (ref 6.5–8.1)

## 2017-01-10 LAB — POCT I-STAT 3, ART BLOOD GAS (G3+)
Acid-base deficit: 21 mmol/L — ABNORMAL HIGH (ref 0.0–2.0)
BICARBONATE: 9.7 mmol/L — AB (ref 20.0–28.0)
O2 Saturation: 92 %
PCO2 ART: 43.1 mmHg (ref 32.0–48.0)
TCO2: 11 mmol/L (ref 0–100)
pH, Arterial: 6.961 — CL (ref 7.350–7.450)
pO2, Arterial: 98 mmHg (ref 83.0–108.0)

## 2017-01-10 LAB — CULTURE, BLOOD (ROUTINE X 2): Culture: NO GROWTH

## 2017-01-10 LAB — TROPONIN I
TROPONIN I: 0.12 ng/mL — AB (ref <0.03)
Troponin I: 0.05 ng/mL (ref <0.03)

## 2017-01-10 LAB — LACTIC ACID, PLASMA
Lactic Acid, Venous: 14.9 mmol/L (ref 0.5–1.9)
Lactic Acid, Venous: 5.9 mmol/L (ref 0.5–1.9)

## 2017-01-10 LAB — POTASSIUM: POTASSIUM: 5.2 mmol/L — AB (ref 3.5–5.1)

## 2017-01-10 MED ORDER — EPINEPHRINE PF 1 MG/10ML IJ SOSY
1.0000 mg | PREFILLED_SYRINGE | Freq: Once | INTRAMUSCULAR | Status: AC
  Administered 2017-01-10: 1 mg via INTRAVENOUS

## 2017-01-10 MED ORDER — PIPERACILLIN-TAZOBACTAM 3.375 G IVPB: 3.3750 g | Freq: Three times a day (TID) | INTRAVENOUS | Status: DC

## 2017-01-10 MED ORDER — DEXTROSE 50 % IV SOLN
INTRAVENOUS | Status: AC
  Administered 2017-01-10: 08:00:00
  Filled 2017-01-10: qty 50

## 2017-01-10 MED ORDER — PIPERACILLIN-TAZOBACTAM 3.375 G IVPB 30 MIN
3.3750 g | Freq: Once | INTRAVENOUS | Status: AC
  Administered 2017-01-10: 3.375 g via INTRAVENOUS
  Filled 2017-01-10: qty 50

## 2017-01-10 MED ORDER — NOREPINEPHRINE BITARTRATE 1 MG/ML IV SOLN
0.0000 ug/min | INTRAVENOUS | Status: DC
  Administered 2017-01-10: 10 ug/min via INTRAVENOUS
  Administered 2017-01-10: 20 ug/min via INTRAVENOUS
  Administered 2017-01-10: 10 ug/min via INTRAVENOUS
  Filled 2017-01-10 (×2): qty 4

## 2017-01-10 MED ORDER — CHLORHEXIDINE GLUCONATE CLOTH 2 % EX PADS
6.0000 | MEDICATED_PAD | Freq: Every day | CUTANEOUS | Status: DC
  Administered 2017-01-10: 6 via TOPICAL

## 2017-01-10 MED ORDER — SODIUM CHLORIDE 0.9% FLUSH
10.0000 mL | Freq: Two times a day (BID) | INTRAVENOUS | Status: DC
  Administered 2017-01-10: 20 mL

## 2017-01-10 MED ORDER — PIPERACILLIN-TAZOBACTAM 3.375 G IVPB 30 MIN
3.3750 g | Freq: Once | INTRAVENOUS | Status: DC
  Filled 2017-01-10: qty 50

## 2017-01-10 MED ORDER — ATROPINE SULFATE 1 MG/10ML IJ SOSY
0.5000 mg | PREFILLED_SYRINGE | Freq: Once | INTRAMUSCULAR | Status: AC
  Administered 2017-01-10: 0.5 mg via INTRAVENOUS

## 2017-01-10 MED ORDER — PIPERACILLIN-TAZOBACTAM IN DEX 2-0.25 GM/50ML IV SOLN
2.2500 g | Freq: Four times a day (QID) | INTRAVENOUS | Status: DC
  Filled 2017-01-10 (×2): qty 50

## 2017-01-10 MED ORDER — DEXTROSE 50 % IV SOLN
INTRAVENOUS | Status: AC
  Filled 2017-01-10: qty 50

## 2017-01-10 MED ORDER — SODIUM CHLORIDE 0.9% FLUSH: 10.0000 mL | INTRAVENOUS | Status: DC | PRN

## 2017-01-10 MED ORDER — DEXTROSE 50 % IV SOLN: 1.0000 | Freq: Once | INTRAVENOUS | Status: AC

## 2017-01-10 MED ORDER — DEXTROSE 5 % IV SOLN
INTRAVENOUS | Status: DC
  Administered 2017-01-10: 11:00:00 via INTRAVENOUS
  Filled 2017-01-10 (×4): qty 150

## 2017-01-10 MED ORDER — EPINEPHRINE NICU 0.1 MG/ML INJECTION
10.0000 mL | Freq: Once | INTRAMUSCULAR | Status: DC
Start: 2017-01-10 — End: 2017-01-10

## 2017-01-13 LAB — VANCOMYCIN, TROUGH: VANCOMYCIN TR: 37 ug/mL — AB (ref 15–20)

## 2017-01-23 ENCOUNTER — Telehealth: Payer: Self-pay

## 2017-01-23 NOTE — Telephone Encounter (Signed)
On 01/23/17 I received a death certificate from [facility] (original). The death certificate is for burial. The patient is a patient of Doctor Byrum. The death certificate will be taken to [facility] ([address]) this pm for signature.  On 08-Feb-2017 I received the death certificate back from Doctor Byrum. I got the death certificate ready for pickup and called the funeral home to let them know the death certificate is ready for pickup. I also faxed a copy to the funeral home per the funeral home rquest

## 2017-01-25 MED FILL — Medication: Qty: 1 | Status: AC

## 2017-02-07 NOTE — Progress Notes (Signed)
eLink Physician-Brief Progress Note Patient Name: Hannah Powers DOB: 1956-03-08 MRN: [id number]   Date of Service  01/27/2017  HPI/Events of Note  Hyperkalemia > hemolysis  eICU Interventions  Repeat K     Intervention Category Major Interventions: Electrolyte abnormality - evaluation and management  Max Fickle 02/04/2017, 4:32 AM

## 2017-02-07 NOTE — Progress Notes (Signed)
PULMONARY / CRITICAL CARE MEDICINE   Name: Hannah Powers MRN: [id number] DOB: 06/13/1956    ADMISSION DATE:  12/25/2016   REFERRING MD:  EDP  CHIEF COMPLAINT:  Aspiration in setting of chronic vent support  HISTORY OF PRESENT ILLNESS:    61 yo mo aaf w OSA / OHS, MMP, resident of [facility] who had massive aspiration 3/29 with food particles in mouth and trach. She was transported to to [facility] ED and CxR was consistent with aspiration + ARDS.   SUBJECTIVE / interval events:  Progressive worsening last 24h:  - oliguria to anuria with associated acute on chronic renal failure; diuresis attempted with high dose lasix and bumex without success - dyspnea in setting progressive metabolic acidosis.  - combined resp failure; was started on fentanyl and changed to PCV 4/2 - am 4/3 hypoxemia, agonal on PCV >> decompensation >> brady / PEA arrest w CPR x 4 minutes. On norepi + bicarb gtt  VITAL SIGNS: BP (!) 154/74 (BP Location: Right Arm)   Pulse (!) 123   Temp 98.9 F (37.2 C)   Resp 15   Ht 5\' 5"  (1.651 m)   Wt (!) 148.8 kg (328 lb)   SpO2 95%   BMI 54.58 kg/m   HEMODYNAMICS:    VENTILATOR SETTINGS: Vent Mode: PRVC FiO2 (%):  [50 %-100 %] 100 % Set Rate:  [20 bmp-26 bmp] 26 bmp Vt Set:  [420 mL-460 mL] 460 mL PEEP:  [8 cmH20-10 cmH20] 8 cmH20 Pressure Support:  [10 cmH20] 10 cmH20 Plateau Pressure:  [29 cmH20-33 cmH20] 31 cmH20  INTAKE / OUTPUT: I/O last 3 completed shifts: In: 2134.7 [P.O.:480; I.V.:1522.7; IV Piggyback:132] Out: -   PHYSICAL EXAMINATION: General: Obese ill-appearing woman, agonal Neuro: obtunded, fentanyl has been stopped, some spont resp effort over the set rate HEENT: OP clear, pupils fixed Cardiovascular: tachycardic, currently 120's, distant Lungs: distant, B insp crackles Abdomen:  Obese, non-distended, hypoactive BS Musculoskeletal: large legs Skin: trace edema.   LABS:   BMET  Recent Labs Lab 01/08/17 0235 01/09/17 0335 01/09/17 1716  25-Jan-2017 0320 25-Jan-2017 0435  NA 143 141 140 138  --   K 3.9 4.0 4.3 5.7* 5.2*  CL 107 107 108 108  --   CO2 23 20*  --  17*  --   BUN 13 14 18  21*  --   CREATININE 1.82* 2.06* 2.90* 3.64*  --   GLUCOSE 117* 125* 119* 82  --     Electrolytes  Recent Labs Lab 12/26/2016 1258  01/08/17 0235 01/09/17 0335 01-25-2017 0320  CALCIUM 8.9  < > 8.4* 8.5* 8.5*  MG 1.7  --   --   --   --   PHOS 3.2  --   --   --   --   < > = values in this interval not displayed.  CBC  Recent Labs Lab 01/08/17 0235 01/09/17 0335 01/09/17 1716 January 25, 2017 0320  WBC 14.9* 22.6*  --  32.4*  HGB 7.1* 7.9* 9.5* 7.9*  HCT 24.3* 27.1* 28.0* 27.7*  PLT 359 447*  --  438*    Coag's  Recent Labs Lab 01/04/2017 1258  APTT 31  INR 1.09    Sepsis Markers  Recent Labs Lab 12/17/2016 1258 12/28/2016 1259 01/02/2017 1316 12/23/2016 2000  LATICACIDVEN  --  1.4 2.05* 1.6  PROCALCITON 1.59  --   --   --     ABG  Recent Labs Lab 01/06/17 0331 01/06/17 0937 01/09/17 1711  PHART 7.383 7.463*  7.202*  PCO2ART 54.6* 44.6 46.3  PO2ART 71.3* 63.0* 103.0    Liver Enzymes  Recent Labs Lab 12/14/2016 1258 01/09/17 0335 01/17/2017 0320  AST 15 18 88*  ALT 13* 16 38  ALKPHOS 101 105 114  BILITOT 1.3* 0.8 1.1  ALBUMIN 2.4* 2.3* 2.2*    Cardiac Enzymes  Recent Labs Lab 01/09/17 1828 17-Jan-2017 0019 01/17/17 0320  TROPONINI 0.04* 0.05* 0.12*    Glucose  Recent Labs Lab 01/09/17 2329 17-Jan-2017 0309 Jan 17, 2017 0725 January 17, 2017 0736 17-Jan-2017 0824 01/17/2017 0846  GLUCAP 120* 83 32* 27* 85 63*    Imaging Dg Chest Port 1 View  Result Date: 01/09/2017 CLINICAL DATA:  Central venous catheter placement EXAM: PORTABLE CHEST 1 VIEW COMPARISON:  Chest radiograph 01/09/2017 FINDINGS: Right IJ approach central venous catheter tip overlies the cavoatrial junction. Cardiomegaly and moderate pulmonary edema are unchanged. IMPRESSION: Right IJ CVC tip at the cavoatrial junction. Otherwise unchanged examination.  Electronically Signed   By: Deatra Robinson M.D.   On: 01/09/2017 21:21   Dg Chest Port 1 View  Result Date: 01/09/2017 CLINICAL DATA:  Respiratory distress, sudden onset. EXAM: PORTABLE CHEST 1 VIEW COMPARISON:  Chest radiograph 01/08/2017 FINDINGS: Tracheostomy tube tip is in unchanged position at level of the clavicular heads. Massive enlargement of the cardiomediastinal silhouette is unchanged. There are worsening bilateral peribronchial opacities. No pneumothorax. Small left pleural effusion with associated atelectasis. IMPRESSION: 1. Worsening pulmonary edema compared to the prior examination. 2. Massive enlargement of the cardiomediastinal silhouette, which may indicate pericardial effusion versus cardiomegaly alone. 3. Possible small left pleural effusion associated atelectasis. Electronically Signed   By: Deatra Robinson M.D.   On: 01/09/2017 17:34   US Abdomen Limited Ruq  Result Date: 01/09/2017 CLINICAL DATA:  Abdominal pain EXAM: US ABDOMEN LIMITED - RIGHT UPPER QUADRANT COMPARISON:  CT scan of the abdomen and pelvis of January 05, 2017 FINDINGS: The study is limited due to the patient's body habitus and excessive bowel gas. The patient was unable to tolerate imaging beyond the gallbladder, common bile duct, and liver. Gallbladder: The gallbladder is partially distended. A definite sonographic Murphy's sign could not be assessed due to the patient's ventilated status. No stones or sludge are observed. The gallbladder wall measures 3.2 mm. Common bile duct: Diameter: 3.5 mm Liver: The hepatic echotexture is mildly increased diffusely. The surface contour appears smooth where visualized. No discrete mass or ductal dilation is observed. There is limited visualization of right kidney with reveals no hydronephrosis or other acute abnormality. IMPRESSION: Limited study revealing partial distention of the gallbladder. No definite stones or sludge. Normal appearance of the liver and common bile duct.  Electronically Signed   By: David  [place] M.D.   On: 01/09/2017 17:00     STUDIES:  3/29 ct abd>>  CULTURES: 3/29 bc x 2>>BCID MRSA>>> 1 of 2 coag-negative staph 3/29 sputum>> 3/29 urine >> multiple species  ANTIBIOTICS: 3/29 vanc>> 3/20 zoysn>> 4/1 4/3 zosyn >>   SIGNIFICANT EVENTS: 3/29 aspiration  LINES/TUBES: #6 shiley cuffed trach>> CVC 4/2 >>   DISCUSSION: 61 yo mo aaf, resident of [facility] who had massive aspiration 3/29 with food particles in mouth and trach. She was transported to to [facility] ED and CxR was consistent with aspiration and she required high fio2 levels. Completed antibiotic for aspiration. Currently working on weaning ventilator needs, not back to her baseline yet.  ASSESSMENT / PLAN:  PULMONARY A: Chronic vent/trach dependency with underlying OHS/OSA. Unclear how often she is off mechanical ventilation at  baseline  Massive food aspiration 3/29, comp by acute lung injury and probable pneumonia Acute on chronic respiratory failure due to ARDS P:   Changed back to [facility] ABG pending If we could somehow stabilize, will need at least qhs MV  CARDIOVASCULAR A:  Cardiac arrest 4/3 Multifactorial shock, likely septic + profound acidosis P:  norepi started, titrate for MAP > 65  RENAL A:   Acute on chronic anuric renal failure Progressive metabolic acidosis P:   Has not responded to diuretics Based on her overall prognosis, medical problems, she is a poor HD candidate (certainly long term and probably also short term). I have recommended that we defer HD, acknowledge that this is an illness that the patient will unfortunately not survive. We will speak further with her daughter   GASTROINTESTINAL A:   Vomiting with aspiration Nutrition  P:   Speech therapy performed FEES and cleared the patient for oral intake with cuff up and on MV; clearly now her clinical status has changed. Will make NPO  INFECTIOUS A:   Presumed aspiration pna HCAP,  treated MRSE 1 of 2 bottles, ? Contaminant New septic shock, decompensation 4/3 P:   Initial course Zosyn completed for aspiration pneumonia Add back empiric abx 4/3 due to her overall decompensation.  Question DC vancomycin if we believe staph epi is a contaminant  HEMATOLOGIC A:   Anemia of chronic disease  Leukocytosis  P:  Follow CBC, no intervention at this time.   ENDOCRINE A:   Hypoglycemia  P:   Follow CBG D5 as needed  NEUROLOGIC A:   Toxic / metabolic encephalopathy  P:   RASS goal: -1 Currently off sedation Likely needs Head Ct to eval for any acute event to explain her decompensation but not stable to travel at this time. Doubt she will survive to pursue this.    FAMILY Have been communicating with the patient's daughter by phone during the events of this am. She understands the acuity and prognosis. We have recommended that pt's overall prognosis and chances for a meaningful recovery will not be enhanced by starting HD. Her daughter understands, is on her way here. She understands that transition to comfort care will be more likely course here. Her goal is to get to [facility] to see her mom. We will try to continue all medical therapy to allow her to get here.   Independent CC time 80 minutes   Levy Pupa, MD, PhD 02/03/17, 9:55 AM [facility] [phone] or if no answer [phone]

## 2017-02-07 NOTE — Progress Notes (Signed)
With family at bedside, pt's HR and BP dropped.  Epi and atropine given per MD order and then family stated not to give anymore medicine.  Pt went asystole at that time.  Emotional support given to family. MD aware and at bedside. 2 RNs auscultated and no heart tones heard.  Pt pronounced at 1616.

## 2017-02-07 NOTE — Progress Notes (Signed)
MD aware of pt's critical lab values.  No new orders at this time. Will continue to monitor closely and update as needed.

## 2017-02-07 NOTE — Progress Notes (Signed)
Upon arrival to patient room patient noted to be "Hannah Powers" breathing.  Attempted to obtain ABG however unable to obtain and also unable to get a doppler pulse in the radial and brachial.  Increased FIO2 to 100%.  MD notified and came to room.  Patient began heart rate began to drop to the 30s.  CPR was initiated and RT began to bag patient.  Pulse returned after CPR initiated and MD is currently attempting to place femoral arterial catheter.  Ventilator changes were made by MD.  Will obtain a follow up ABG once arterial line is placed.  Will continue to monitor.

## 2017-02-07 NOTE — Progress Notes (Signed)
Upon initial assessment at start of my shift, pt's BP was soft.  Pt was not following commands, would slightly flicker eyes if I yelled her name.  Pupils 3 round and non reactive.  BP continued to drop and it became harder to obtain o2 sat despite changing probe multiple times.  MD called and came to bedside.  Pt continued to decompensate and code blue was started at 0845.  See code note for further details.  Daughter notified of pt's condition prior to code starting.  Daughter is on her way from [place].  emotional support given.  Will continue to monitor closely and update as needed.

## 2017-02-07 NOTE — Progress Notes (Signed)
Progress Note  Goals of Care   Spoke with Daughter at bedside regarding Code Status and overall prognosis. Daughter understand the severity of her mother's illness and that currently she is being supported with mechanical ventilation and pressors. Daughter understands that further CPR would not be beneficial for mother. Plans to continue pressors and ventilator support for the next 24 hours as family is arriving from out of town. Will readdress goals and possible comfort care status further 4/4. Chaplain was called to bedside to offer emotional/spiritual support.   Jovita Kussmaul, AG-ACNP [facility]  Pgr: [phone]  PCCM Pgr: [phone]

## 2017-02-07 NOTE — Progress Notes (Signed)
Responded to consult that pt coded, not doing well. Per nurse, pt had passed, family had gone. Invited staff to page for chaplain upon imminent death if family desires.     02-09-2017 1700  Clinical Encounter Type  Visited With Health care provider  Visit Type Initial;Death  Referral From Nurse   Ephraim Hamburger, Chaplain

## 2017-02-07 NOTE — Procedures (Signed)
PCCM CPR NOTE  Called to room urgently to eval pt who was desaturated on PCV, evolved hypotension over last 30 min, became obtunded. Last interacting effectively with RN's around 4:30-5:00.   Per review of notes and discussion with staff she has been progressively dyspneic last 24h, has also had progressive anuric renal failure and associated metabolic acidosis. She was started on fentanyl gtt last pm, changed to PCV for tachypnea and uncomfortable resp pattern.   On my eval she was agonal on PCV, frequently getting no volumes. CBG was low and D50 given x 2. BP in 60's and pulse difficult to palpate or doppler. HR dropped to 30-40's and atropine given. No change and then pulse no longer palpable. CPR initiated. Received epi x 1, D50 (half amp) + insulin 10u, calcium, bicarb x 1 amp. Norepi gtt titrated up and Na bicarb gtt started empirically under assumption that decompensation due to combined metabolic and resp acidosis. Sinus tachycardia after 4 minutes. Pulse obtained after 4 minutes.   BMP, CBC, ABG all pending. Will place art line now.   Situation discussed with patient's daughter by Idolina Primer, NP by phone. She is travelling from [place] now. Will need to address the role of possible HD here. Unclear to me that she will truly benefit given her overall QOL. Will temporize medically, discuss this issue with her daughter when she arrives.    Levy Pupa, MD, PhD 01/15/2017, 9:20 AM [facility] [phone] or if no answer [phone]

## 2017-02-07 NOTE — Progress Notes (Signed)
Hypoglycemic Event  CBG: 27  Treatment: D50 IV 50 mL  Symptoms: Pale and Sweaty  Follow-up CBG: [id number] CBG Result:85  Possible Reasons for Event: Inadequate meal intake  Comments/MD notified:byrum, pt very unstable. About to code d/t other reasons     Rikki Spearing

## 2017-02-07 NOTE — Progress Notes (Signed)
Pharmacy Antibiotic Note  Hannah Powers is a 61 y.o. 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 She is now s/p cardiac arrest this am on pressors.  Pharmacy consulted to dose zosyn. Goals of care pending. -WBC= 31.1, tmax= 100.8, SCr= 4.05 (up) -4/3 random vancomycin level= 37 on 1500mg  IV q24h; last vanc was 4/2 at ~ 11am  Plan: -Zosyn 3.375gm IV x1 followed by 2.25gm IV q6h -Hold vancomycin will check a vancomycin level in 36-48hrs if therapy continues -Will follow renal function, cultures and clinical progress   Height: 5\' 5"  (165.1 cm) Weight: (!) 328 lb (148.8 kg) IBW/kg (Calculated) : 57  Temp (24hrs), Avg:99.2 F (37.3 C), Min:98.9 F (37.2 C), Max:99.6 F (37.6 C)   Recent Labs Lab 2017-01-22 0926  Jan 22, 2017 1259 2017-01-22 1316 Jan 22, 2017 2000 01/06/17 0039 01/06/17 01/08/17 01/07/17 0235 01/08/17 0235 01/09/17 0335 01/09/17 1716 01/28/2017 0320 01/14/2017 0853 02/05/2017 0856  WBC  --   < >  --   --   --  24.7*  --   --  14.9* 22.6*  --  32.4* 31.1*  --   CREATININE  --   < >  --   --   --  2.48*  --  2.22* 1.82* 2.06* 2.90* 3.64* 4.05*  --   LATICACIDVEN 2.03*  --  1.4 2.05* 1.6  --   --   --   --   --   --   --   --  14.9*  VANCOTROUGH  --   --   --   --   --   --   --   --   --   --   --   --  37*  --   VANCORANDOM  --   --   --   --   --   --  31 21  --   --   --   --   --   --   < > = values in this interval not displayed.  Estimated Creatinine Clearance: 22.1 mL/min (A) (by C-G formula based on SCr of 4.05 mg/dL (H)).    No Known Allergies  Antimicrobials this admission:  3/29 vanc >>  3/29 zosyn >> 3/31  Dose adjustments this admission:  3/30 VR 31 (@ 0922 on 3/30, last dose vanc 2500mg  at 1312 on 3/29)  3/31 VR 0230 21  Microbiology results:  3/29 BCx: 1/2 GPC (BCID MRSA)  3/30 MRSA PCR: Pos  4/31, Pharm D 01/12/2017 1:40 PM

## 2017-02-07 NOTE — Progress Notes (Addendum)
PROGRESS NOTE    Hannah Powers  ZOX:[id number] DOB: 28-Apr-1956 DOA: 01/03/2017 PCP: [name], MD   Brief Narrative:  61 yo F resident of [facility] who had massive aspiration 3/29 with food particles noted in mouth and trach. She was transported to to [facility] ED where a CXR was consistent with aspiration and she required high FiO2 levels. She was admitted by PCCM.  She has been on broad-spectrum antibiotics vancomycin and Zosyn. Blood cultures from March 29 in one bottle grew MRSA-possible contamination?     Assessment & Plan:   Active Problems:   Respiratory failure (HCC)   Meconium aspiration pneumonia   Pain of upper abdomen   Psychogenic vomiting with nausea   ARDS (adult respiratory distress syndrome) (HCC)   Encounter for central line placement   Pressure injury of skin  Acute severe respiratory failure likely secondary to aspiration pneumonia and now fluid overload. Cardiogenic shock -Continue trach care ventilatory -Even after getting Lasix 160 mg and 80 mg she remains enuretic. This morning she is very fluid overloaded and hypotensive with concerns of cardiogenic failure. Order Bumex IV if bp tolerates and consult nephrology. PCCM is at bedside due to severe hypertension. She she may end up needing dialysis for fluid removal. Place Foley for strict input and output -Check ABG; consider CT head once stable to evaluate for any other causes of her encephalopathy.  -Consider putting in arterial line as it's difficult to get blood pressure on her at this time. -Continue broad-spectrum antibiotics -Patient will need to go back to the ICU. Repeat labs including lactate. There is mild metabolic acidosis noted on the labs - likely in the setting on renal injury.  -Continue broad-spectrum antibiotics given her white count continues to rise at this time.  Elevated troponins -Continue to trend. Recommend echo -This is likely due to fluid overload and poor renal clearance. Continue to  monitor  Acute kidney injury -Likely from poor perfusion in the setting of severe volume overload -CT of the abdomen done at the time of admission showed moderate right-sided hydronephrosis without any acute obstruction which was likely thought to be chronic in nature. Renal ultrasound has been ordered to be done when more stable.  -Consult nephrology. Check urine electrolytes and osm   Hypertension by History -Hold any hypertensive medication at this time she is hypotensive. Will need pressors- consider levophed.    Anemia of chronic disease -Hemoglobin stable at this time. We'll continue to monitor -Consider transfusion of falls below 8.  Schizophrenia  -cont home meds when able to.   Hx of RA -MTX on hold.   DVT prophylaxis: Hep Sub Q Code Status: Full code  Family Communication:  None present at this time  Disposition Plan: Needs ICU levels of care. Poor prognosis.   Addendum 11am After reviewing the chart again it appears went into cardiac arrest after my evaluation earlier. PCCM attending was involved to help out at the time. Cont care as planned.  Consultants:   PCCM   Antimicrobials:   Vanc   Zosyn   Subjective: Patient was noted to be hypoxic therefore rapid response was called yesterday. Patient was given Lasix 160 mg and then 80 mg IV without any diuresis but saturations improved later. Patient remained on vent overnight. This morning patient is hypotensive and looks acutely short of breath with signs of fluid overload. Unable to get much response out of her. She still remains anuric.  Objective: Vitals:   01-28-17 0805 January 28, 2017 0815 2017/01/28 0830  01/11/2017 0840  BP: (!) 72/47 (!) 71/51 (!) 53/26 (!) 119/104  Pulse:   97   Resp: (!) 23 (!) 23 17 (!) 22  Temp:      TempSrc:      SpO2:   (!) 89%   Weight:      Height:        Intake/Output Summary (Last 24 hours) at 01/19/2017 0906 Last data filed at 01/09/2017 0800  Gross per 24 hour  Intake            539.67 ml  Output                0 ml  Net           539.67 ml   Filed Weights   01/08/17 0600 01/09/17 0406 02/06/2017 0315  Weight: (!) 139.7 kg (308 lb) (!) 145.2 kg (320 lb) (!) 148.8 kg (328 lb)    Examination:  General exam: lethargic.  Respiratory system: diffuse coarse BS, + trach  inplace - on vent  Cardiovascular system: S1 & S2 heard, RRR. No JVD, murmurs, rubs, gallops or clicks. No pedal edema. Gastrointestinal system: Abdomen is distended,  No organomegaly or masses felt. Normal bowel sounds heard. Central nervous system: unable to assess Extremities: cool ext.  Skin: No rashes, lesions or ulcers Psychiatry: unable to asses    Data Reviewed:   CBC:  Recent Labs Lab 01/23/2017 0915 01/20/2017 1258 01/06/17 0039 01/08/17 0235 01/09/17 0335 01/09/17 1716 02/04/2017 0320  WBC 19.2* 24.7* 24.7* 14.9* 22.6*  --  32.4*  NEUTROABS 15.7*  --   --  10.5*  --   --   --   HGB 9.5* 9.6* 7.6* 7.1* 7.9* 9.5* 7.9*  HCT 33.0* 32.6* 26.7* 24.3* 27.1* 28.0* 27.7*  MCV 95.9 94.8 95.7 94.6 93.8  --  95.5  PLT 426* 432* 415* 359 447*  --  438*   Basic Metabolic Panel:  Recent Labs Lab 01/27/2017 1258 01/06/17 0039 01/07/17 0235 01/08/17 0235 01/09/17 0335 01/09/17 1716 02/01/2017 0320 01/23/2017 0435  NA 142 141 143 143 141 140 138  --   K 3.8 3.7 3.2* 3.9 4.0 4.3 5.7* 5.2*  CL 98* 98* 103 107 107 108 108  --   CO2 28 29 29 23  20*  --  17*  --   GLUCOSE 156* 150* 127* 117* 125* 119* 82  --   BUN 14 19 20 13 14 18  21*  --   CREATININE 1.66* 2.48* 2.22* 1.82* 2.06* 2.90* 3.64*  --   CALCIUM 8.9 8.5* 8.3* 8.4* 8.5*  --  8.5*  --   MG 1.7  --   --   --   --   --   --   --   PHOS 3.2  --   --   --   --   --   --   --    GFR: Estimated Creatinine Clearance: 24.6 mL/min (A) (by C-G formula based on SCr of 3.64 mg/dL (H)). Liver Function Tests:  Recent Labs Lab 02/02/2017 0915 01/19/2017 1258 01/09/17 0335 02/01/2017 0320  AST 15 15 18  88*  ALT 14 13* 16 38  ALKPHOS 106 101  105 114  BILITOT 1.0 1.3* 0.8 1.1  PROT 7.3 7.3 7.1 7.1  ALBUMIN 2.6* 2.4* 2.3* 2.2*    Recent Labs Lab 01/11/2017 1258  LIPASE 11  AMYLASE 88   No results for input(s): AMMONIA in the last 168 hours. Coagulation Profile:  Recent Labs  Lab 01/04/2017 1258  INR 1.09   Cardiac Enzymes:  Recent Labs Lab 12/23/2016 2000 01/06/17 0039 01/09/17 1828 01-24-2017 0019 01-24-2017 0320  TROPONINI 0.03* 0.10* 0.04* 0.05* 0.12*   BNP (last 3 results) No results for input(s): PROBNP in the last 8760 hours. HbA1C: No results for input(s): HGBA1C in the last 72 hours. CBG:  Recent Labs Lab 01-24-2017 0309 2017-01-24 0725 2017-01-24 0736 01/24/2017 0824 2017-01-24 0846  GLUCAP 83 32* 27* 85 63*   Lipid Profile: No results for input(s): CHOL, HDL, LDLCALC, TRIG, CHOLHDL, LDLDIRECT in the last 72 hours. Thyroid Function Tests: No results for input(s): TSH, T4TOTAL, FREET4, T3FREE, THYROIDAB in the last 72 hours. Anemia Panel:  Recent Labs  01/09/17 0335  VITAMINB12 3,083*  FOLATE 8.6  FERRITIN 971*  TIBC 179*  IRON 14*  RETICCTPCT 1.4   Sepsis Labs:  Recent Labs Lab 12/28/2016 0926 01/04/2017 1258 12/11/2016 1259 12/10/2016 1316 12/10/2016 2000  PROCALCITON  --  1.59  --   --   --   LATICACIDVEN 2.03*  --  1.4 2.05* 1.6    Recent Results (from the past 240 hour(s))  Blood Culture (routine x 2)     Status: None (Preliminary result)   Collection Time: 12/08/2016  9:05 AM  Result Value Ref Range Status   Specimen Description BLOOD RIGHT BREAST  Final   Special Requests BOTTLES DRAWN AEROBIC AND ANAEROBIC  5CC  Final   Culture NO GROWTH 4 DAYS  Final   Report Status PENDING  Incomplete  Blood Culture (routine x 2)     Status: Abnormal   Collection Time: 01/02/2017  9:10 AM  Result Value Ref Range Status   Specimen Description BLOOD LEFT HAND  Final   Special Requests BOTTLES DRAWN AEROBIC ONLY  5CC  Final   Culture  Setup Time   Final    GRAM POSITIVE COCCI IN CLUSTERS AEROBIC BOTTLE  ONLY CRITICAL RESULT CALLED TO, READ BACK BY AND VERIFIED WITH: A. JOHNSTON PHARMD, AT 01/06/17 BY D. VANHOOK    Culture (A)  Final    STAPHYLOCOCCUS SPECIES (COAGULASE NEGATIVE) THE SIGNIFICANCE OF ISOLATING THIS ORGANISM FROM A SINGLE SET OF BLOOD CULTURES WHEN MULTIPLE SETS ARE DRAWN IS UNCERTAIN. PLEASE NOTIFY THE MICROBIOLOGY DEPARTMENT WITHIN ONE WEEK IF SPECIATION AND SENSITIVITIES ARE REQUIRED.    Report Status 01/08/2017 FINAL  Final  Blood Culture ID Panel (Reflexed)     Status: Abnormal   Collection Time: 01/01/2017  9:10 AM  Result Value Ref Range Status   Enterococcus species NOT DETECTED NOT DETECTED Final   Listeria monocytogenes NOT DETECTED NOT DETECTED Final   Staphylococcus species DETECTED (A) NOT DETECTED Final    Comment: Methicillin (oxacillin) resistant coagulase negative staphylococcus. Possible blood culture contaminant (unless isolated from more than one blood culture draw or clinical case suggests pathogenicity). No antibiotic treatment is indicated for blood  culture contaminants. CRITICAL RESULT CALLED TO, READ BACK BY AND VERIFIED WITH: A. JOHNSTON PHARMD, AT 01/06/17 BY D. VANHOOK    Staphylococcus aureus NOT DETECTED NOT DETECTED Final   Methicillin resistance DETECTED (A) NOT DETECTED Final    Comment: CRITICAL RESULT CALLED TO, READ BACK BY AND VERIFIED WITH: A. JOHNSTON PHARMD, AT 01/06/17 BY D. VANHOOK    Streptococcus species NOT DETECTED NOT DETECTED Final   Streptococcus agalactiae NOT DETECTED NOT DETECTED Final   Streptococcus pneumoniae NOT DETECTED NOT DETECTED Final   Streptococcus pyogenes NOT DETECTED NOT DETECTED Final   Acinetobacter baumannii NOT DETECTED NOT DETECTED Final  Enterobacteriaceae species NOT DETECTED NOT DETECTED Final   Enterobacter cloacae complex NOT DETECTED NOT DETECTED Final   Escherichia coli NOT DETECTED NOT DETECTED Final   Klebsiella oxytoca NOT DETECTED NOT DETECTED Final   Klebsiella pneumoniae NOT DETECTED NOT  DETECTED Final   Proteus species NOT DETECTED NOT DETECTED Final   Serratia marcescens NOT DETECTED NOT DETECTED Final   Haemophilus influenzae NOT DETECTED NOT DETECTED Final   Neisseria meningitidis NOT DETECTED NOT DETECTED Final   Pseudomonas aeruginosa NOT DETECTED NOT DETECTED Final   Candida albicans NOT DETECTED NOT DETECTED Final   Candida glabrata NOT DETECTED NOT DETECTED Final   Candida krusei NOT DETECTED NOT DETECTED Final   Candida parapsilosis NOT DETECTED NOT DETECTED Final   Candida tropicalis NOT DETECTED NOT DETECTED Final  Urine culture     Status: Abnormal   Collection Time: 01/31/2017 12:30 PM  Result Value Ref Range Status   Specimen Description URINE, CLEAN CATCH  Final   Special Requests Normal  Final   Culture MULTIPLE SPECIES PRESENT, SUGGEST RECOLLECTION (A)  Final   Report Status 01/06/2017 FINAL  Final  MRSA PCR Screening     Status: Abnormal   Collection Time: 01/31/2017  9:43 PM  Result Value Ref Range Status   MRSA by PCR POSITIVE (A) NEGATIVE Final    Comment:        The GeneXpert MRSA Assay (FDA approved for NASAL specimens only), is one component of a comprehensive MRSA colonization surveillance program. It is not intended to diagnose MRSA infection nor to guide or monitor treatment for MRSA infections. RESULT CALLED TO, READ BACK BY AND VERIFIED WITH: Carmelina Peal @0219  01/06/17 MKELLY,MLT          Radiology Studies: Dg Chest Port 1 View  Result Date: 01/09/2017 CLINICAL DATA:  Central venous catheter placement EXAM: PORTABLE CHEST 1 VIEW COMPARISON:  Chest radiograph 01/09/2017 FINDINGS: Right IJ approach central venous catheter tip overlies the cavoatrial junction. Cardiomegaly and moderate pulmonary edema are unchanged. IMPRESSION: Right IJ CVC tip at the cavoatrial junction. Otherwise unchanged examination. Electronically Signed   By: Deatra Robinson M.D.   On: 01/09/2017 21:21   Dg Chest Port 1 View  Result Date: 01/09/2017 CLINICAL  DATA:  Respiratory distress, sudden onset. EXAM: PORTABLE CHEST 1 VIEW COMPARISON:  Chest radiograph 01/08/2017 FINDINGS: Tracheostomy tube tip is in unchanged position at level of the clavicular heads. Massive enlargement of the cardiomediastinal silhouette is unchanged. There are worsening bilateral peribronchial opacities. No pneumothorax. Small left pleural effusion with associated atelectasis. IMPRESSION: 1. Worsening pulmonary edema compared to the prior examination. 2. Massive enlargement of the cardiomediastinal silhouette, which may indicate pericardial effusion versus cardiomegaly alone. 3. Possible small left pleural effusion associated atelectasis. Electronically Signed   By: Deatra Robinson M.D.   On: 01/09/2017 17:34   US Abdomen Limited Ruq  Result Date: 01/09/2017 CLINICAL DATA:  Abdominal pain EXAM: US ABDOMEN LIMITED - RIGHT UPPER QUADRANT COMPARISON:  CT scan of the abdomen and pelvis of Jan 31, 2017 FINDINGS: The study is limited due to the patient's body habitus and excessive bowel gas. The patient was unable to tolerate imaging beyond the gallbladder, common bile duct, and liver. Gallbladder: The gallbladder is partially distended. A definite sonographic Murphy's sign could not be assessed due to the patient's ventilated status. No stones or sludge are observed. The gallbladder wall measures 3.2 mm. Common bile duct: Diameter: 3.5 mm Liver: The hepatic echotexture is mildly increased diffusely. The surface contour appears smooth  where visualized. No discrete mass or ductal dilation is observed. There is limited visualization of right kidney with reveals no hydronephrosis or other acute abnormality. IMPRESSION: Limited study revealing partial distention of the gallbladder. No definite stones or sludge. Normal appearance of the liver and common bile duct. Electronically Signed   By: David  [place] M.D.   On: 01/09/2017 17:00        Scheduled Meds: . antiseptic oral rinse  15 mL Mouth  Rinse QID  . aspirin  150 mg Rectal Daily  . chlorhexidine  15 mL Mouth/Throat BID  . Chlorhexidine Gluconate Cloth  6 each Topical Daily  . dextrose      . DULoxetine  30 mg Oral Daily  . famotidine  20 mg Oral Daily  . fentaNYL  12.5 mcg Transdermal Q72H  . gabapentin  100 mg Oral BID  . heparin  5,000 Units Subcutaneous Q8H  . insulin aspart  0-9 Units Subcutaneous Q4H  . ipratropium-albuterol  3 mL Nebulization QID  . metoprolol  2.5 mg Intravenous Q6H  . mupirocin ointment  1 application Nasal BID  . sodium chloride flush  10-40 mL Intracatheter Q12H   Continuous Infusions: . sodium chloride 10 mL/hr at 02/05/17 0700  . fentaNYL infusion INTRAVENOUS Stopped (02-05-2017 0700)  . nitroGLYCERIN Stopped (02/05/17 0103)  . norepinephrine (LEVOPHED) Adult infusion       LOS: 5 days    Time spent: 40 mins     Genavie Boettger Joline Maxcy, MD [facility] Pager [phone]   If 7PM-7AM, please contact night-coverage [email or web address] Password [email or web address] 2017-02-05, 9:06 AM

## 2017-02-07 NOTE — Progress Notes (Signed)
100cc fentanyl wasted in sink witnessed by 2 RNs.

## 2017-02-07 DEATH — deceased

## 2017-03-10 NOTE — Discharge Summary (Signed)
PULMONARY / CRITICAL CARE MEDICINE DEATH SUMMARY   Name: Hannah Powers MRN: [id number] DOB: [birth year]    ADMISSION DATE:  2017-01-20  DATE OF DEATH: January 25, 2017  Final cause of death Acute on chronic respiratory failure with hypoxemia and hypercapnia  Secondary causes of death Emesis with food aspiration Aspiration pneumonia ARDS Septic shock Methicillin-resistant Staphylococcus epi bacteremia, probable contaminant Acute cardio-pulmonary arrest / PEA arrest  Morbid obesity Obstructive sleep apnea Obesity hypoventilation syndrome Chronic ventilator dependence Tracheostomy dependence Secondary pulmonary hypertension Acute oliguric renal failure Reported chronic renal insufficiency (baseline creatinine unknown) Acute metabolic acidosis Anemia of chronic disease and of critical illness Leukocytosis Hypoglycemia Toxic metabolic encephalopathy History of GERD History of schizophrenia History of hypertension   HISTORY OF PRESENT ILLNESS / HOSPITAL COURSE:   61 yo obese woman w OSA / OHS, chronic respiratory failure with ventilator dependence. She was a resident of [facility], had been cleared by swallowing study to take by mouth. Unfortunately experienced massive aspiration 2023/01/21 with food particles in mouth and trach. She was transported to to [facility] ED and CxR was consistent with aspiration pneumonia + ARDS. She was ventilated, treated with broad-spectrum antibiotics. She developed shock most consistent with septic shock, treated with IV fluids and pressors. Also developed acute renal failure, superimposed on reported chronic renal insufficiency (baseline unknown). Bicarbonate drip was initiated. She did not respond to high-dose diuretics. On 01-26-23 in the setting of progressive acidosis and worsening pulmonary infiltrates she experienced a cardiopulmonary arrest required CPR briefly. She was resuscitated but remained unstable. Posterior overall condition and progressive renal failure was felt  that she could not survive, certainly would not get back to her previous debilitated baseline. Discussions were undertaken with the patient's family. Decision was made to defer hemodialysis. Other supportive care was continued but decision was also made to defer escalation or CPR if and when she declined. She expired on Jan 25, 2017.   Levy Pupa, MD, PhD 02/08/2017, 6:35 AM [facility] [phone] or if no answer [phone]

## 2018-12-24 IMAGING — US US RENAL
1 series · 14 of 25 positions shown · non-contrast
Comparison: CT abdomen pelvis 01/05/2017

CLINICAL DATA: Acute kidney injury, history hypertension, pulmonary
hypertension, obesity

EXAM:
RENAL / URINARY TRACT ULTRASOUND COMPLETE

[Series 1: us renal · 0.33mm/px · 14 of 25 slices shown]
[im 1/25]
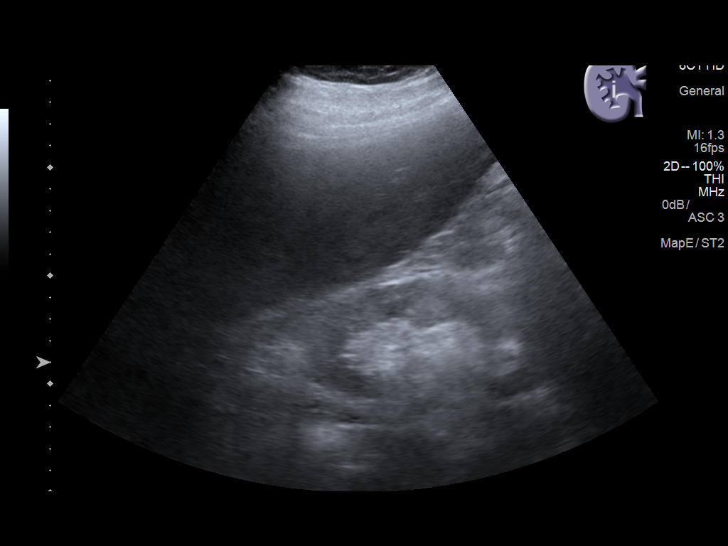
[im 3/25]
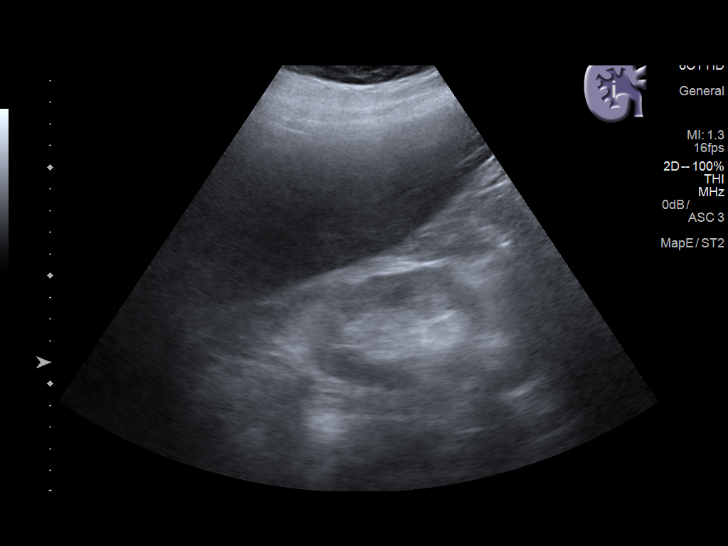
[im 5/25]
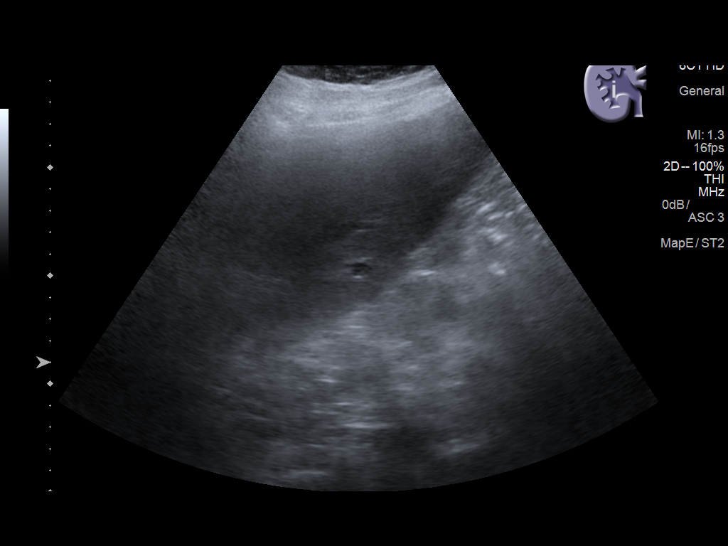
[im 7/25]
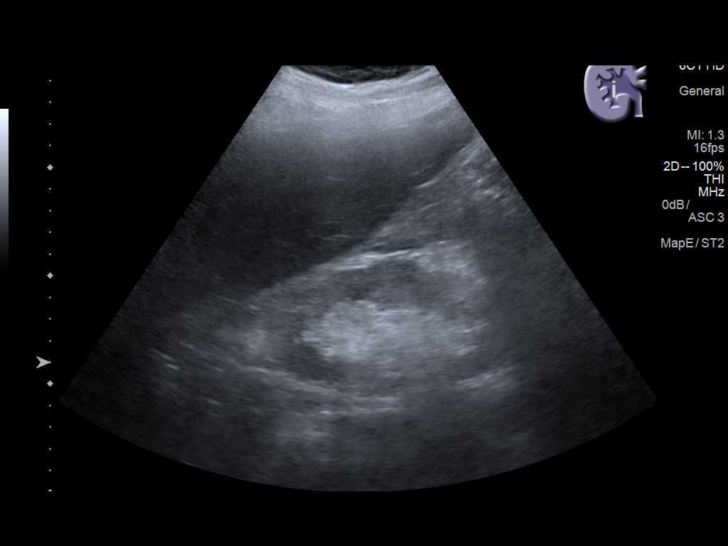
[im 9/25]
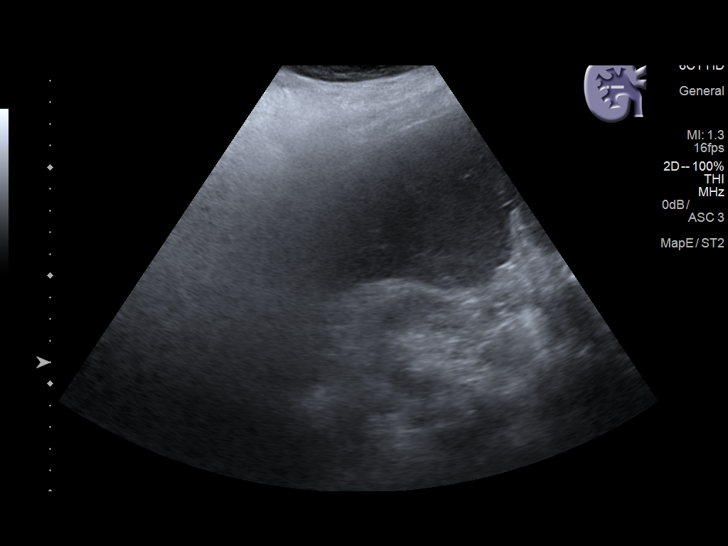
[im 10/25]
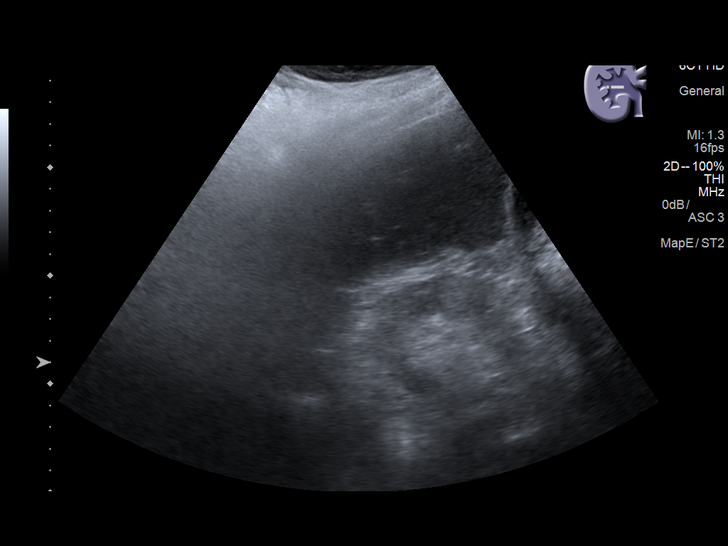
[im 12/25]
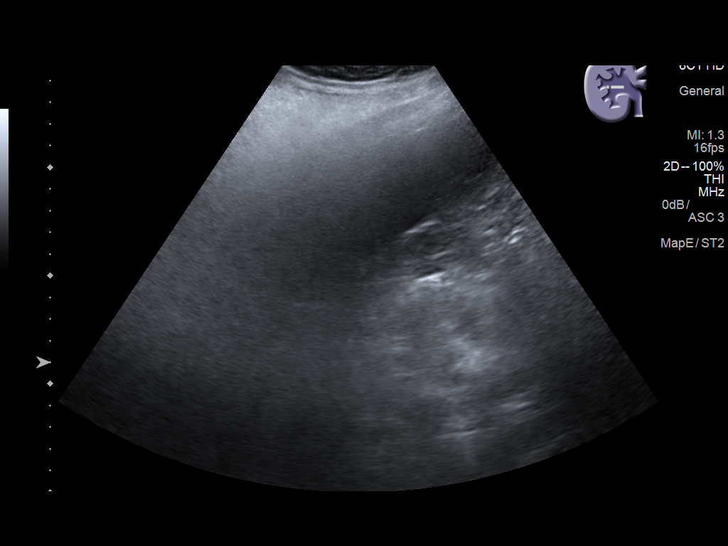
[im 14/25]
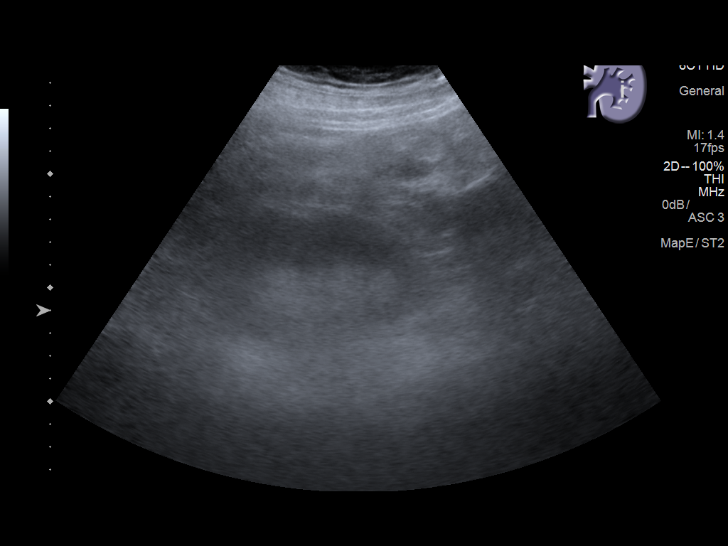
[im 16/25]
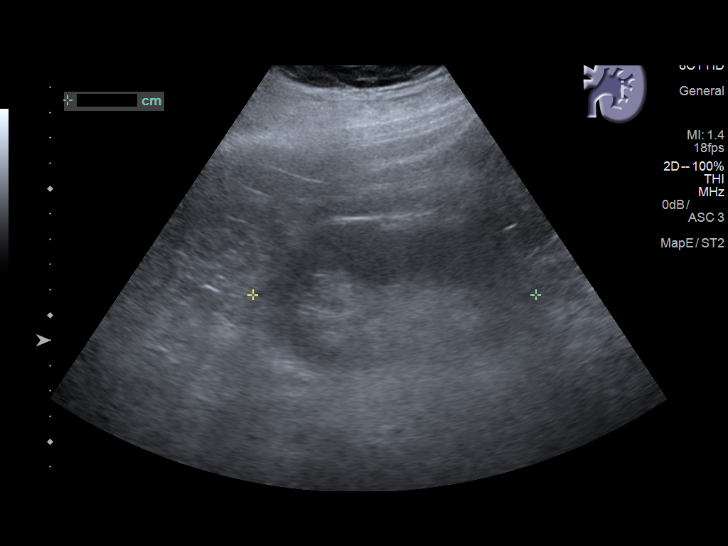
[im 17/25]
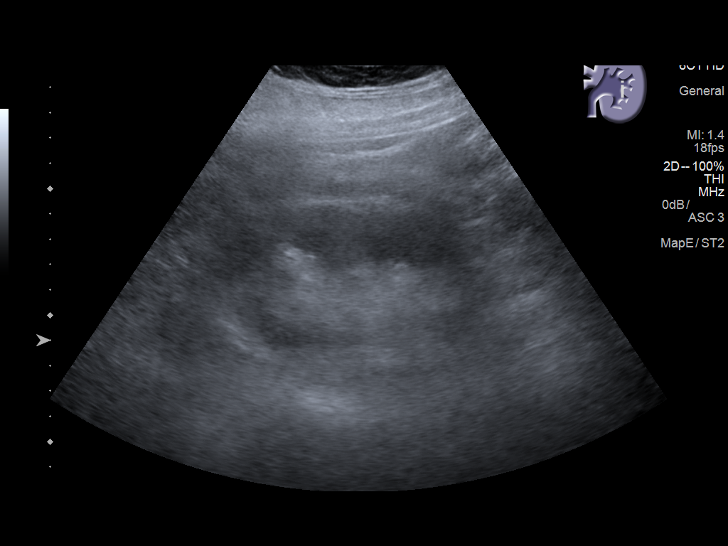
[im 19/25]
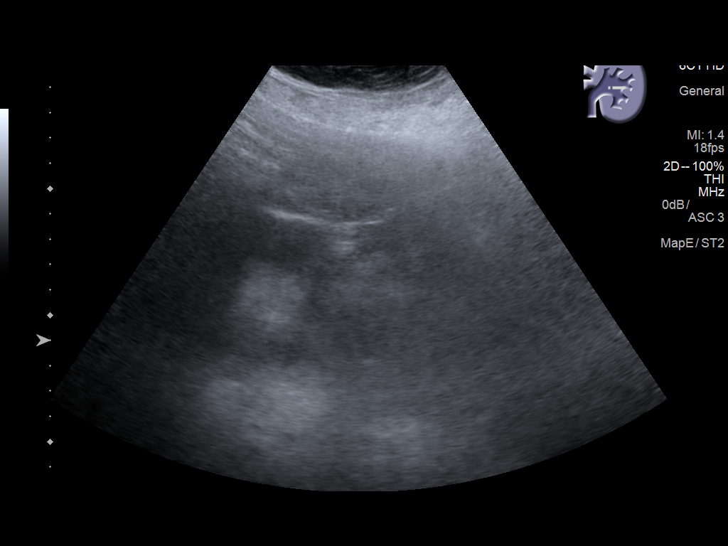
[im 21/25]
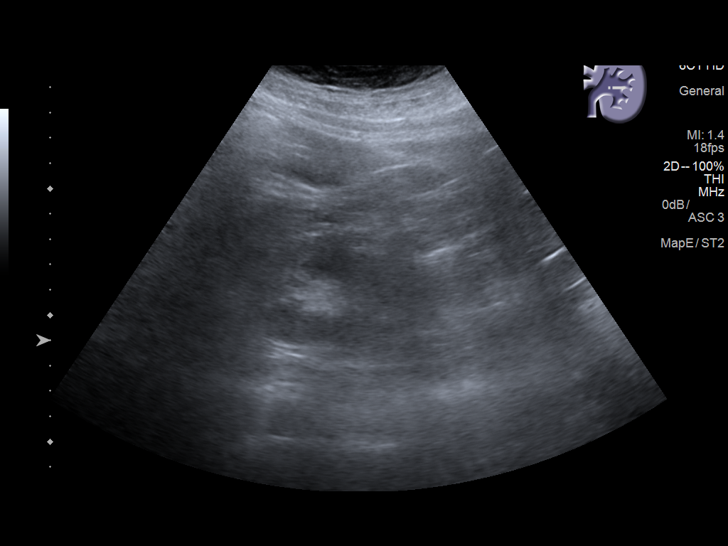
[im 23/25]
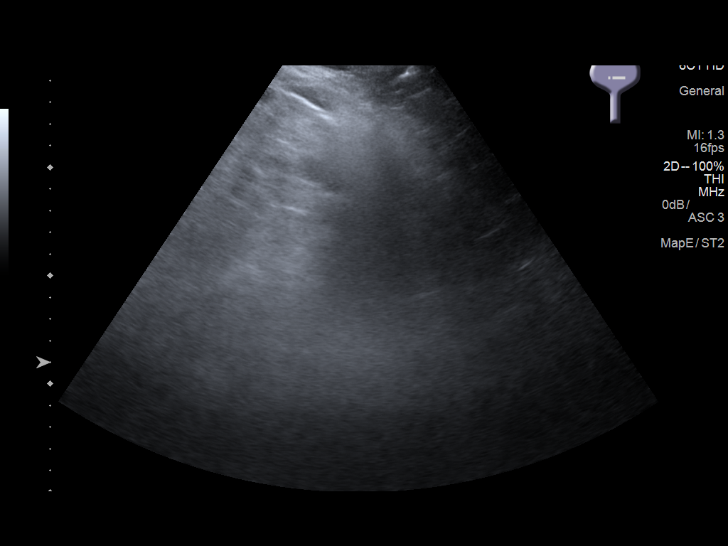
[im 25/25]
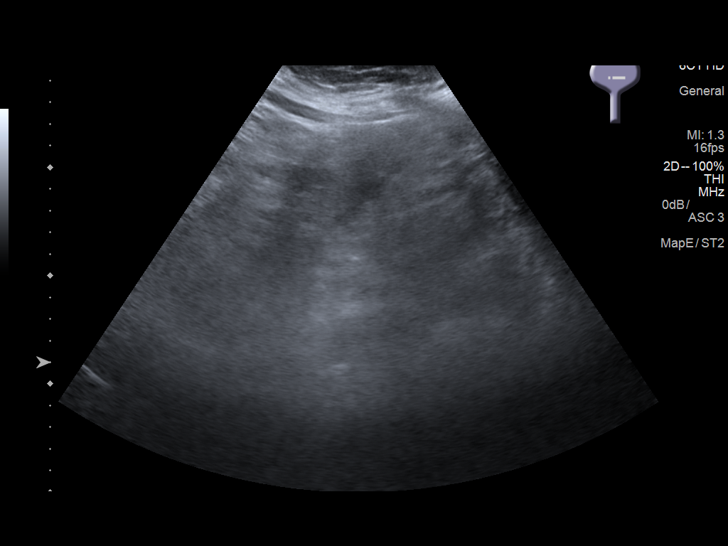

[14 of 25 positions shown; findings below may reference images not displayed]

FINDINGS: Right Kidney:

Length: 12.1 cm. Mild age-related cortical thinning. Increased
cortical echogenicity. No mass, hydronephrosis or shadowing
calcification.

Left Kidney:

Length: 11.5 cm. Age-related cortical thinning. Minimally increased
cortical echogenicity. No mass, hydronephrosis or shadowing
calcification.

Bladder:

Appears normal for degree of bladder distention.
IMPRESSION: Medical renal disease changes of both kidneys.

No evidence of renal mass or hydronephrosis.
